# Patient Record
Sex: Female | Born: 1955 | Race: White | Hispanic: No | Marital: Married | Smoking: Never smoker
Health system: Southern US, Community
[De-identification: ages and names within clinical notes are randomized; demographics above are authoritative.]

## PROBLEM LIST (undated history)

## (undated) DIAGNOSIS — K219 Gastro-esophageal reflux disease without esophagitis: Secondary | ICD-10-CM

## (undated) DIAGNOSIS — M5126 Other intervertebral disc displacement, lumbar region: Secondary | ICD-10-CM

## (undated) DIAGNOSIS — T7840XA Allergy, unspecified, initial encounter: Secondary | ICD-10-CM

## (undated) DIAGNOSIS — Z8719 Personal history of other diseases of the digestive system: Secondary | ICD-10-CM

## (undated) DIAGNOSIS — Z780 Asymptomatic menopausal state: Secondary | ICD-10-CM

## (undated) DIAGNOSIS — K222 Esophageal obstruction: Secondary | ICD-10-CM

## (undated) HISTORY — DX: Other intervertebral disc displacement, lumbar region: M51.26

## (undated) HISTORY — PX: TOTAL ABDOMINAL HYSTERECTOMY: SHX209

## (undated) HISTORY — PX: COLONOSCOPY: SHX174

## (undated) HISTORY — DX: Esophageal obstruction: K22.2

## (undated) HISTORY — DX: Allergy, unspecified, initial encounter: T78.40XA

## (undated) HISTORY — DX: Gastro-esophageal reflux disease without esophagitis: K21.9

## (undated) HISTORY — PX: CHOLECYSTECTOMY: SHX55

## (undated) HISTORY — DX: Personal history of other diseases of the digestive system: Z87.19

## (undated) HISTORY — DX: Asymptomatic menopausal state: Z78.0

---

## 1999-02-05 ENCOUNTER — Other Ambulatory Visit: Admission: RE | Admit: 1999-02-05 | Discharge: 1999-02-05 | Payer: Self-pay | Admitting: *Deleted

## 2000-07-07 ENCOUNTER — Other Ambulatory Visit: Admission: RE | Admit: 2000-07-07 | Discharge: 2000-07-07 | Payer: Self-pay | Admitting: Family Medicine

## 2002-04-26 ENCOUNTER — Other Ambulatory Visit: Admission: RE | Admit: 2002-04-26 | Discharge: 2002-04-26 | Payer: Self-pay | Admitting: Family Medicine

## 2004-01-02 ENCOUNTER — Other Ambulatory Visit: Admission: RE | Admit: 2004-01-02 | Discharge: 2004-01-02 | Payer: Self-pay | Admitting: Family Medicine

## 2008-07-23 ENCOUNTER — Encounter (INDEPENDENT_AMBULATORY_CARE_PROVIDER_SITE_OTHER): Payer: Self-pay | Admitting: *Deleted

## 2008-08-07 ENCOUNTER — Ambulatory Visit: Payer: Self-pay | Admitting: Family Medicine

## 2008-08-07 DIAGNOSIS — J3489 Other specified disorders of nose and nasal sinuses: Secondary | ICD-10-CM | POA: Insufficient documentation

## 2009-01-27 ENCOUNTER — Ambulatory Visit: Payer: Self-pay | Admitting: Internal Medicine

## 2009-01-27 DIAGNOSIS — J069 Acute upper respiratory infection, unspecified: Secondary | ICD-10-CM | POA: Insufficient documentation

## 2009-08-22 ENCOUNTER — Ambulatory Visit: Payer: Self-pay | Admitting: Family Medicine

## 2009-08-22 DIAGNOSIS — M79609 Pain in unspecified limb: Secondary | ICD-10-CM | POA: Insufficient documentation

## 2009-08-25 ENCOUNTER — Telehealth: Payer: Self-pay | Admitting: Family Medicine

## 2009-08-26 ENCOUNTER — Telehealth: Payer: Self-pay | Admitting: Family Medicine

## 2009-08-26 ENCOUNTER — Encounter: Payer: Self-pay | Admitting: Family Medicine

## 2009-11-21 ENCOUNTER — Telehealth: Payer: Self-pay | Admitting: Family Medicine

## 2010-05-08 ENCOUNTER — Telehealth: Payer: Self-pay | Admitting: Family Medicine

## 2010-06-09 ENCOUNTER — Ambulatory Visit (HOSPITAL_COMMUNITY): Admission: RE | Admit: 2010-06-09 | Discharge: 2010-06-10 | Payer: Self-pay | Admitting: Obstetrics and Gynecology

## 2010-06-09 ENCOUNTER — Encounter (INDEPENDENT_AMBULATORY_CARE_PROVIDER_SITE_OTHER): Payer: Self-pay | Admitting: Obstetrics and Gynecology

## 2010-07-12 HISTORY — PX: ABDOMINAL HYSTERECTOMY: SHX81

## 2010-08-11 NOTE — Progress Notes (Signed)
Summary: FAILED REFERRAL  Phone Note Other Incoming   Summary of Call: REF ORTHOPAEDIC HAND REFERRAL.....Marland KitchenPER FAX FROM [facility], THEY CONTACTED PT TO [facility] & PATIENT INFORMED THEM SHE DOES NOT WANT AN APPT. Initial call taken by: Magdalen Spatz [facility],  August 26, 2009 3:53 PM  Follow-up for Phone Call        pt should need no more pain meds then---  Follow-up by: Loreen Freud DO,  August 26, 2009 4:04 PM

## 2010-08-11 NOTE — Progress Notes (Signed)
Summary: Pain meds her sick(lmom 2/15)  Phone Note Call from Patient Call back at Home Phone [phone]   Reason for Call: Talk to Doctor Details for Reason: Pain meds making her sick. Summary of Call: Patient is taking the Tramadol and it is making her sick on her stomach and dizzy and nauseous. She has taken ibuprofen 800mg  before and it seemed to help. Can you call her something else in. She said when she takes OTC meds she feels like she takes to many of them.  [facility] [address] [facility] Initial call taken by: Harold Barban,  August 26, 2009 11:01 AM  Follow-up for Phone Call        mobic 15mg   1/2 - 1 by mouth once daily as needed #30   Follow-up by: Loreen Freud DO,  August 26, 2009 11:55 AM  Additional Follow-up for Phone Call Additional follow up Details #1::        LMTCB. Army Fossa CMA  August 26, 2009 12:05 PM     Additional Follow-up for Phone Call Additional follow up Details #2::    Pt is aware. Army Fossa CMA  August 26, 2009 1:30 PM   New/Updated Medications: MOBIC 15 MG TABS (MELOXICAM) 1/2- 1 by mouth once daily as needed. Prescriptions: MOBIC 15 MG TABS (MELOXICAM) 1/2- 1 by mouth once daily as needed.  #30 x 0   Entered by:   Army Fossa CMA   Authorized by:   Loreen Freud DO   Signed by:   Army Fossa CMA on 08/26/2009   Method used:   [facility]  [address] [id number]       [address]       [place], [place]  [address]       Ph: [phone] or [phone]       Fax: [phone]   RxID:   [id number]

## 2010-08-11 NOTE — Assessment & Plan Note (Signed)
Summary: HURT RIGHT HAND LAST WEEK ///SPH   Vital Signs:  Patient profile:   55 year old female Weight:      167 pounds Temp:     98.7 degrees F oral Pulse rate:   65 / minute Pulse rhythm:   regular BP sitting:   102 / 80  (left arm) Cuff size:   large  Vitals Entered By: Army Fossa CMA (August 22, 2009 3:37 PM) CC: Pt c/o right hand pain in between knuckles for over a week and a half.    History of Present Illness:  Injury      This is a 55 year old woman who presents with An injury.  The symptoms began 2 weeks ago.  Pt here c/o hit R hand on counter top about 2 weeks ago.  It swelled and then went down but was left with a knot that is painful.  The patient also reports tenderness.  The patient denies swelling, redness, increased warmth deformity, blood loss, numbness, weakness, loss of sensation, coolness of extremity, and loss of consciousness.  The patient denies the following risk factors for significant bleeding: aspirin use, anticoagulant use, and history of bleeding disorder.  Screening for risk of abuse was negative.    Current Medications (verified): 1)  None  Allergies (verified): No Known Drug Allergies  Past History:  Past medical, surgical, family and social histories (including risk factors) reviewed for relevance to current acute and chronic problems.  Past Medical History: Reviewed history from 08/07/2008 and no changes required. menopause at age 55  Past Surgical History: Reviewed history from 08/07/2008 and no changes required. Cholecystectomy  Family History: Reviewed history from 08/07/2008 and no changes required. CAD-no HTN-no DM-mother,father STROKE-grandparent COLON CA-no BREAST CA-grandmother  Social History: Reviewed history from 08/07/2008 and no changes required. lives w/ husband and daughter Baxter Hire no tobacco works as Midwife at [employer]  Review of Systems      See HPI  Physical Exam  General:   Well-developed,well-nourished,in no acute distress; alert,appropriate and cooperative throughout examination Extremities:  R hand-- + nodule between 2nd and 3rd PIP no swelling or errythema tender to touch Psych:  Oriented X3 and normally interactive.     Impression & Recommendations:  Problem # 1:  HAND PAIN, RIGHT (ICD-729.5) consider ortho referral if pt gets no relief Orders: T-Hand Right 3 views (73130TC) Ankle / Wrist Splint (T5176)

## 2010-08-11 NOTE — Progress Notes (Signed)
Summary: REQUEST FOR PAIN MED  Phone Note Call from Patient Call back at Home Phone [phone]   Caller: Patient Call For: [name],Alison Anderson Reason for Call: Talk to Nurse Summary of Call: PATIENT CALLING, SHE WAS SEEN FOR HER RIGHT HAND INJURY ON 08-22-2009 BY DR. Laury Axon, HAND STILL SWOLLEN, VERY PAINFUL.  REQUESTING RX FOR PAIN MED [facility] [address] Initial call taken by: Magdalen Spatz [facility],  August 25, 2009 9:34 AM  Follow-up for Phone Call        ultram 50 mg  #60  1 by mouth every 6 hours as needed and refer to hand surgeon Follow-up by: Loreen Freud DO,  August 25, 2009 9:53 AM  Additional Follow-up for Phone Call Additional follow up Details #1::        Pt is aware. Army Fossa CMA  August 25, 2009 10:27 AM     New/Updated Medications: ULTRAM 50 MG TABS (TRAMADOL HCL) 1 by mouth every 6 hours as needed for pain. Prescriptions: ULTRAM 50 MG TABS (TRAMADOL HCL) 1 by mouth every 6 hours as needed for pain.  #60 x 0   Entered by:   Army Fossa CMA   Authorized by:   Loreen Freud DO   Signed by:   Army Fossa CMA on 08/25/2009   Method used:   [facility]  [address] [id number]       [address]       [place], [place]  [address]       Ph: [phone] or [phone]       Fax: [phone]   RxID:   [id number]

## 2010-08-11 NOTE — Progress Notes (Signed)
Summary: review x-ray,pain med,referralfyi pt wil hold off on referral  Phone Note Call from Patient Call back at [facility] Phone [phone]   Caller: Patient Summary of Call:  PATIENT left VM that SHE WAS SEEN FOR HER RIGHT HAND INJURY ON 08-22-2009 BY DR. Laury Axon DUE TO Annalyse Langlais ON LEAVE. PT STILL C/O PAIN IN HAND. PT WOULD LIKE FOR X-RAY TO BE REVIEW AGAIN BECAUSE SHE FEELS THAT IT SHOULD HAVE REVEALS SOMETHING SINCE SHE IS STILL EXPERIENCE THE PAIN. PT WOULD ALSO LIKE ANOTHER PAIN MED RX, and REFERRAL TO SEE ORTHO SURGEON..see phone note 08-26-09...........Marland KitchenFelecia Deloach CMA  Nov 21, 2009 10:13 AM   Follow-up for Phone Call        LEFT MSG FOR PT TO CALL RE; DR Beverely Low RECOMMENDATIONS. PER DR Timmia Cogburn PT CAN TAKE IBUPROFEN 4 EVERY 8 HOURS AND CAN ALTERNATE WITH TYLENOL EVERY 4 HOURS. NEEDS TO GO TO ORTHO, ALSO IF SX  SEVERE CAN GO TO [facility] AFTER HOURS WALK IN CLINIC ON [address] .Kandice Hams  Nov 21, 2009 10:50 AM Spoke with pt given Dr Beverely Low recommendations, pt say she will hold off on Ortho Referral for now, will try Ibuprofen and alt with tylenol, in ref to walk in cliinic pt says she does not want to pay a $70 copay, .Kandice Hams  Nov 21, 2009 12:47 PM    Additional Follow-up for Phone Call Additional follow up Details #1::        noted Additional Follow-up by: Neena Rhymes MD,  Nov 21, 2009 12:48 PM

## 2010-08-11 NOTE — Progress Notes (Signed)
Summary: FYI surgery APPT  Phone Note Call from Patient   Summary of Call: patient called to let Dr Beverely Low know that she is going to have a partial hysterectomy - but she doesnt want to use the gyn at [facility] because they are asking for a large deposit - she wants a referral to [facility] - patient doesnt want to make an appt  Initial call taken by: Okey Regal [place],  May 08, 2010 4:16 PM  Follow-up for Phone Call        left message on machine .........Marland KitchenDoristine Devoid CMA  May 11, 2010 2:08 PM   Additional Follow-up for Phone Call Additional follow up Details #1::        Spoke with pt can disregard info given earlier. Pt has work everything out and is to see Dr Edward Jolly at [facility]. Pt would just like dr Beverely Low to be aware that she has a pending appt for surgery on 06-09-10............Marland KitchenFelecia Deloach CMA  May 11, 2010 3:55 PM     Additional Follow-up for Phone Call Additional follow up Details #2::    noted Follow-up by: Neena Rhymes MD,  May 11, 2010 3:58 PM

## 2010-08-11 NOTE — Letter (Signed)
Summary: Patient Does Not Want Appt/[facility]  Patient Does Not Want Appt/[facility]   Imported By: Lanelle Bal 08/30/2009 08:02:42  _____________________________________________________________________  External Attachment:    Type:   Image     Comment:   External Document

## 2010-09-22 LAB — COMPREHENSIVE METABOLIC PANEL
ALT: 20 U/L (ref 0–35)
Albumin: 4 g/dL (ref 3.5–5.2)
Alkaline Phosphatase: 46 U/L (ref 39–117)
BUN: 17 mg/dL (ref 6–23)
CO2: 27 mEq/L (ref 19–32)
Calcium: 9.5 mg/dL (ref 8.4–10.5)
Creatinine, Ser: 0.83 mg/dL (ref 0.4–1.2)
Glucose, Bld: 76 mg/dL (ref 70–99)
Potassium: 4.1 mEq/L (ref 3.5–5.1)
Sodium: 140 mEq/L (ref 135–145)

## 2010-09-22 LAB — URINALYSIS, ROUTINE W REFLEX MICROSCOPIC
Glucose, UA: NEGATIVE mg/dL
Hgb urine dipstick: NEGATIVE
Ketones, ur: NEGATIVE mg/dL
Protein, ur: NEGATIVE mg/dL
Specific Gravity, Urine: 1.01 (ref 1.005–1.030)
pH: 7 (ref 5.0–8.0)

## 2010-09-22 LAB — CBC
HCT: 33.2 % — ABNORMAL LOW (ref 36.0–46.0)
HCT: 39.5 % (ref 36.0–46.0)
Hemoglobin: 11.1 g/dL — ABNORMAL LOW (ref 12.0–15.0)
Hemoglobin: 13.1 g/dL (ref 12.0–15.0)
MCH: 31.6 pg (ref 26.0–34.0)
MCHC: 33.5 g/dL (ref 30.0–36.0)
MCV: 94.2 fL (ref 78.0–100.0)
MCV: 94.5 fL (ref 78.0–100.0)
RDW: 13.3 % (ref 11.5–15.5)
RDW: 13.9 % (ref 11.5–15.5)
WBC: 5.5 10*3/uL (ref 4.0–10.5)

## 2010-09-22 LAB — BASIC METABOLIC PANEL
BUN: 10 mg/dL (ref 6–23)
CO2: 30 mEq/L (ref 19–32)
Chloride: 100 mEq/L (ref 96–112)
Creatinine, Ser: 0.7 mg/dL (ref 0.4–1.2)
GFR calc non Af Amer: >60 mL/min (ref >60)
Glucose, Bld: 126 mg/dL — ABNORMAL HIGH (ref 70–99)

## 2011-02-17 ENCOUNTER — Encounter: Payer: Self-pay | Admitting: Family Medicine

## 2011-02-19 ENCOUNTER — Encounter: Payer: Self-pay | Admitting: Family Medicine

## 2011-02-19 ENCOUNTER — Ambulatory Visit (INDEPENDENT_AMBULATORY_CARE_PROVIDER_SITE_OTHER): Payer: BC Managed Care – PPO | Admitting: Family Medicine

## 2011-02-19 DIAGNOSIS — M79609 Pain in unspecified limb: Secondary | ICD-10-CM

## 2011-02-19 DIAGNOSIS — M79606 Pain in leg, unspecified: Secondary | ICD-10-CM

## 2011-02-19 MED ORDER — CYCLOBENZAPRINE HCL 10 MG PO TABS: 10.0000 mg | ORAL_TABLET | Freq: Three times a day (TID) | ORAL | Status: DC | PRN

## 2011-02-19 MED ORDER — NAPROXEN 500 MG PO TABS: 500.0000 mg | ORAL_TABLET | Freq: Two times a day (BID) | ORAL | Status: DC

## 2011-02-19 MED ORDER — HYDROCODONE-ACETAMINOPHEN 5-500 MG PO TABS: 1.0000 | ORAL_TABLET | ORAL | Status: AC | PRN

## 2011-02-19 NOTE — Progress Notes (Signed)
  Subjective:    Patient ID: Alison Anderson, female    DOB: 02-01-1956, 55 y.o.   MRN: [id number]  HPI Larey Seat at work- fall occurred 11 days ago, slipped on wet floor.  Fell on R side.  Within last 2 days developed increased pain on R lateral thigh and groin.  Has been taking ibuprofen w/out relief.  Will hurt w/ rolling over or changing position.  No pain at rest.   Review of Systems For ROS see HPI     Objective:   Physical Exam  Vitals reviewed. Constitutional: She appears well-developed and well-nourished. No distress.  Musculoskeletal: Normal range of motion. She exhibits tenderness (over R lateral quad). She exhibits no edema.       Full flexion/extension, internal/external rotation of R hip. No bony tenderness over R hip or pelvis  Neurological: She has normal reflexes. She exhibits normal muscle tone. Coordination normal.  Skin: Skin is warm and dry. No erythema.          Assessment & Plan:

## 2011-02-19 NOTE — Patient Instructions (Addendum)
This appears to be a muscular and not bony injury Start the Naproxen daily for pain relief- take for 7-10 days regularly and then as needed.  Take w/ food to avoid upset stomach Use the Flexeril as needed for muscle spasm- take at night to help w/ sleep Take the Vicodin as needed for severe pain Alternate ice and heat for pain relief If no better by mid week- call and we'll get xrays Hang in there!

## 2011-02-22 ENCOUNTER — Other Ambulatory Visit: Payer: Self-pay | Admitting: Family Medicine

## 2011-02-22 NOTE — Telephone Encounter (Signed)
Should have been faxed on Friday but phoned in just in case pharmacy did not receive fax

## 2011-02-23 DIAGNOSIS — M79606 Pain in leg, unspecified: Secondary | ICD-10-CM | POA: Insufficient documentation

## 2011-02-23 NOTE — Assessment & Plan Note (Signed)
Pt's leg pain consistent w/ muscular pain rather than joint or bone pain.  Likely a deep tissue bruise/strain.  Start NSAIDs, muscle relaxers, and pain meds prn.  If no improvement will get xrays and refer to ortho.  Reviewed supportive care and red flags that should prompt return.  Pt expressed understanding and is in agreement w/ plan.

## 2013-06-15 ENCOUNTER — Ambulatory Visit (INDEPENDENT_AMBULATORY_CARE_PROVIDER_SITE_OTHER): Payer: BC Managed Care – PPO | Admitting: Family Medicine

## 2013-06-15 ENCOUNTER — Encounter: Payer: Self-pay | Admitting: Family Medicine

## 2013-06-15 VITALS — BP 120/80 | HR 89 | Temp 98.1°F | Resp 16 | Wt 167.0 lb

## 2013-06-15 DIAGNOSIS — M62830 Muscle spasm of back: Secondary | ICD-10-CM | POA: Insufficient documentation

## 2013-06-15 DIAGNOSIS — M5380 Other specified dorsopathies, site unspecified: Secondary | ICD-10-CM

## 2013-06-15 MED ORDER — NAPROXEN 500 MG PO TABS: 500.0000 mg | ORAL_TABLET | Freq: Two times a day (BID) | ORAL | Status: DC

## 2013-06-15 MED ORDER — CYCLOBENZAPRINE HCL 10 MG PO TABS: 10.0000 mg | ORAL_TABLET | Freq: Three times a day (TID) | ORAL | Status: DC | PRN

## 2013-06-15 NOTE — Patient Instructions (Signed)
Schedule your complete physical at your convenience Start the Naproxen twice daily- take w/ food Use the Flexeril for muscle spasm- will cause drowsiness so best on nights/weekend HEAT! Call with any questions or concerns Hang in there! Happy Holidays!

## 2013-06-15 NOTE — Assessment & Plan Note (Signed)
New.  Start scheduled NSAIDs, muscle relaxer prn.  Heat.  Reviewed supportive care and red flags that should prompt return.  Pt expressed understanding and is in agreement w/ plan.

## 2013-06-15 NOTE — Progress Notes (Signed)
   Subjective:    Patient ID: Alison Anderson, female    DOB: 1955/11/15, 57 y.o.   MRN: [id number]  HPI Pre visit review using our clinic review tool, if applicable. No additional management support is needed unless otherwise documented below in the visit note.  Back pain- sxs started 'a couple of week at least'.  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  Took a few of husband's ibuprofen 800mg  w/ some relief.  This AM was unable to stand upright when getting out of bed.  Has been 'bent over' all day.  No radiation of pain, localized to lumbar region.  Pain improves w/ bending forward, worse w/ extension.  No bowel or bladder incontinence.  No weakness or numbness of legs.   Review of Systems For ROS see HPI     Objective:   Physical Exam  Vitals reviewed. Constitutional: She is oriented to person, place, and time. She appears well-developed and well-nourished. No distress.  Cardiovascular: Intact distal pulses.   Musculoskeletal: She exhibits tenderness (over lumbar paraspinals bilaterally). She exhibits no edema.  + lumbar paraspinal spasm Pain w/ extension, no pain w/ flexion (-) SLR bilaterally  Neurological: She is alert and oriented to person, place, and time. She has normal reflexes. No cranial nerve deficit. Coordination normal.  Skin: Skin is warm and dry.          Assessment & Plan:

## 2013-06-18 ENCOUNTER — Telehealth: Payer: Self-pay | Admitting: *Deleted

## 2013-06-18 NOTE — Telephone Encounter (Signed)
Patient called and left message on triage line. She would like to know if she can be prescribed a stronger pain medication. She stated she had to miss work today because of the pain. Please advise.

## 2013-06-18 NOTE — Telephone Encounter (Signed)
Ok for Tramadol 50mg  TID prn pain.  #30, no refills

## 2013-06-20 ENCOUNTER — Telehealth: Payer: Self-pay | Admitting: *Deleted

## 2013-06-20 ENCOUNTER — Other Ambulatory Visit: Payer: Self-pay | Admitting: *Deleted

## 2013-06-20 MED ORDER — TRAMADOL HCL 50 MG PO TABS: 50.0000 mg | ORAL_TABLET | Freq: Three times a day (TID) | ORAL | Status: DC | PRN

## 2013-06-20 NOTE — Telephone Encounter (Signed)
Rx sent by Ricarda Frame

## 2013-06-20 NOTE — Telephone Encounter (Signed)
ok'd Tramadol #30, TID PRN in phone note from 12/8

## 2013-06-20 NOTE — Telephone Encounter (Signed)
Patient called and stated that she was given Naproxen and feels like its not working. Patient states that she has to work through the pain while at work. Patient states she will like to know if there is something else that she can take that will last longer. SW

## 2013-06-20 NOTE — Telephone Encounter (Signed)
Rx fro [facility]ad[facility] [address]

## 2013-06-21 MED ORDER — HYDROCODONE-ACETAMINOPHEN 5-325 MG PO TABS: 1.0000 | ORAL_TABLET | Freq: Four times a day (QID) | ORAL | Status: DC | PRN

## 2013-06-21 NOTE — Telephone Encounter (Signed)
Patient notified and state that she will by to pick up

## 2013-06-21 NOTE — Telephone Encounter (Signed)
Patient has tramadol on her allergies list. Is there anything else that she can take? Please advise. SW

## 2013-06-21 NOTE — Telephone Encounter (Signed)
Ok for Hydrocodone 5/235mg  1 tab every 6 hrs PRN.  #20, no refills.  Needs to pick up

## 2013-06-25 ENCOUNTER — Other Ambulatory Visit: Payer: Self-pay | Admitting: Family Medicine

## 2013-06-25 ENCOUNTER — Telehealth: Payer: Self-pay | Admitting: *Deleted

## 2013-06-25 NOTE — Telephone Encounter (Signed)
Unfortunately I authorized the tramadol prescription in error in attempts to help the pt's pain.  There appears to have been mistakes at multiple levels- here in our office, the pharmacy in filling prescription that is listed as allergy, and husband in paying for med.  Please call pharmacy to determine how this could have made it through.  At this time, since pt has no insurance it may be best to reimburse since there was a mistake.  Please convey my apologies and tell Alison Anderson how thankful we are she did not take the med.

## 2013-06-25 NOTE — Telephone Encounter (Signed)
Patient called and stated that she had called last week to get a stronger pain medication (see last phone note). Patient's husband came and picked up the printed prescription for the hydrocodone and took it to the pharmacy to be filled. Patient states that when he picked up the prescription and was given the prescription for the for Tramadol. Patient called very upset that the medication is on her allergy list. Patient states that she would like to know why this was sent in and by who. Patient also stressed th fact that she is on a fix income and would like for someone to reimbursed for the cost of that medication. Patient wanted to speak with an manger. Patient can be called back at [phone] Please advise.

## 2013-06-25 NOTE — Telephone Encounter (Signed)
Spoke with patient and expressed that we are sorry that this has happened. Patient states that she was very upset at the time but now  understand that mistakes happen. Pharmacy was called and made aware of allergies. Patient states that the prescription was $12.00. Made patient aware that this is being handled by management. Patient fully understood. SW

## 2013-06-27 NOTE — Telephone Encounter (Signed)
Arlys John,   With Harriett Sine out until tomorrow, can you help with this?   Dois Davenport

## 2014-03-14 ENCOUNTER — Other Ambulatory Visit: Payer: Self-pay | Admitting: Family Medicine

## 2014-03-14 DIAGNOSIS — Z1231 Encounter for screening mammogram for malignant neoplasm of breast: Secondary | ICD-10-CM

## 2014-03-19 ENCOUNTER — Telehealth: Payer: Self-pay | Admitting: Family Medicine

## 2014-03-19 NOTE — Telephone Encounter (Signed)
Error

## 2014-03-27 ENCOUNTER — Ambulatory Visit (HOSPITAL_BASED_OUTPATIENT_CLINIC_OR_DEPARTMENT_OTHER)
Admission: RE | Admit: 2014-03-27 | Discharge: 2014-03-27 | Disposition: A | Discharge status: Home / Self Care | Payer: BC Managed Care – PPO | Source: Ambulatory Visit | Attending: Family Medicine | Admitting: Family Medicine

## 2014-03-27 DIAGNOSIS — Z1231 Encounter for screening mammogram for malignant neoplasm of breast: Secondary | ICD-10-CM | POA: Diagnosis present

## 2014-04-19 ENCOUNTER — Ambulatory Visit: Payer: BC Managed Care – PPO | Admitting: Family Medicine

## 2014-06-05 ENCOUNTER — Ambulatory Visit (INDEPENDENT_AMBULATORY_CARE_PROVIDER_SITE_OTHER): Payer: BC Managed Care – PPO | Admitting: Family Medicine

## 2014-06-05 ENCOUNTER — Other Ambulatory Visit: Payer: Self-pay | Admitting: Family Medicine

## 2014-06-05 ENCOUNTER — Encounter: Payer: Self-pay | Admitting: Family Medicine

## 2014-06-05 ENCOUNTER — Telehealth: Payer: Self-pay | Admitting: *Deleted

## 2014-06-05 VITALS — BP 122/78 | HR 79 | Temp 97.9°F | Resp 16 | Ht 67.5 in | Wt 166.5 lb

## 2014-06-05 DIAGNOSIS — Z23 Encounter for immunization: Secondary | ICD-10-CM

## 2014-06-05 DIAGNOSIS — Z0000 Encounter for general adult medical examination without abnormal findings: Secondary | ICD-10-CM

## 2014-06-05 LAB — CBC WITH DIFFERENTIAL/PLATELET
BASOS ABS: 0.1 10*3/uL (ref 0.0–0.1)
Basophils Relative: 1 % (ref 0–1)
Eosinophils Absolute: 0.2 10*3/uL (ref 0.0–0.7)
Eosinophils Relative: 4 % (ref 0–5)
HEMATOCRIT: 40 % (ref 36.0–46.0)
HEMOGLOBIN: 12.9 g/dL (ref 12.0–15.0)
LYMPHS PCT: 33 % (ref 12–46)
Lymphs Abs: 2 10*3/uL (ref 0.7–4.0)
MCH: 29.3 pg (ref 26.0–34.0)
MCHC: 32.3 g/dL (ref 30.0–36.0)
MCV: 90.7 fL (ref 78.0–100.0)
MONO ABS: 0.3 10*3/uL (ref 0.1–1.0)
MONOS PCT: 5 % (ref 3–12)
MPV: 11.1 fL (ref 9.4–12.4)
NEUTROS ABS: 3.5 10*3/uL (ref 1.7–7.7)
Neutrophils Relative %: 57 % (ref 43–77)
Platelets: 259 10*3/uL (ref 150–400)
RBC: 4.41 MIL/uL (ref 3.87–5.11)
RDW: 13.9 % (ref 11.5–15.5)
WBC: 6.1 10*3/uL (ref 4.0–10.5)

## 2014-06-05 LAB — BASIC METABOLIC PANEL
BUN: 19 mg/dL (ref 6–23)
CO2: 28 mEq/L (ref 19–32)
Calcium: 9.4 mg/dL (ref 8.4–10.5)
Chloride: 102 mEq/L (ref 96–112)
Creat: 0.81 mg/dL (ref 0.50–1.10)
GLUCOSE: 87 mg/dL (ref 70–99)
POTASSIUM: 3.9 meq/L (ref 3.5–5.3)
SODIUM: 139 meq/L (ref 135–145)

## 2014-06-05 LAB — HEPATIC FUNCTION PANEL
ALBUMIN: 4 g/dL (ref 3.5–5.2)
ALT: 14 U/L (ref 0–35)
AST: 23 U/L (ref 0–37)
Alkaline Phosphatase: 46 U/L (ref 39–117)
BILIRUBIN DIRECT: 0.1 mg/dL (ref 0.0–0.3)
Indirect Bilirubin: 0.2 mg/dL (ref 0.2–1.2)
TOTAL PROTEIN: 6.5 g/dL (ref 6.0–8.3)
Total Bilirubin: 0.3 mg/dL (ref 0.2–1.2)

## 2014-06-05 LAB — LIPID PANEL
CHOL/HDL RATIO: 2.1 ratio
CHOLESTEROL: 165 mg/dL (ref 0–200)
HDL: 78 mg/dL (ref >39)
LDL Cholesterol: 73 mg/dL (ref 0–99)
Triglycerides: 71 mg/dL (ref <150)
VLDL: 14 mg/dL (ref 0–40)

## 2014-06-05 NOTE — Telephone Encounter (Signed)
Pt here for cpe and asked at end of visit if she should start medication for urinary frequency that she has at night (3-4 times a night) for "a long time" and also wanted to talk with Provider re: muscle cramps. Per verbal from Provider, pt should schedule office visit at her earliest convenience to discuss both concerns. Attempted to reach pt. Call was answered and disconnected.

## 2014-06-05 NOTE — Progress Notes (Signed)
Pre visit review using our clinic review tool, if applicable. No additional management support is needed unless otherwise documented below in the visit note. 

## 2014-06-05 NOTE — Patient Instructions (Signed)
Follow up in 1 year or as needed We'll notify you of your lab results and make any changes if needed Keep up the good work on healthy diet and regular exercise Call with any questions or concerns Happy Holidays!!! 

## 2014-06-05 NOTE — Progress Notes (Signed)
   Subjective:    Patient ID: Alison MontgomeryKaren F Sapia, female    DOB: [birth year], 58 y.o.   MRN: [id number]  HPI CPE- no concerns.  UTD on mammo,  No need for pap smears.  Due for colonoscopy but pt is dealing w/ husband's health issues and is going to defer at this time.  UTD on flu shot.  Plan to update Tdap today.   Review of Systems Patient reports no vision/ hearing changes, adenopathy,fever, weight change,  persistant/recurrent hoarseness , swallowing issues, chest pain, palpitations, edema, persistant/recurrent cough, hemoptysis, dyspnea (rest/exertional/paroxysmal nocturnal), gastrointestinal bleeding (melena, rectal bleeding), abdominal pain, significant heartburn, bowel changes, GU symptoms (dysuria, hematuria, incontinence), Gyn symptoms (abnormal  bleeding, pain),  syncope, focal weakness, memory loss, numbness & tingling, skin/hair/nail changes, abnormal bruising or bleeding, anxiety, or depression.     Objective:   Physical Exam General Appearance:    Alert, cooperative, no distress, appears stated age  Head:    Normocephalic, without obvious abnormality, atraumatic  Eyes:    PERRL, conjunctiva/corneas clear, EOM's intact, fundi    benign, both eyes  Ears:    Normal TM's and external ear canals, both ears  Nose:   Nares normal, septum midline, mucosa normal, no drainage    or sinus tenderness  Throat:   Lips, mucosa, and tongue normal; teeth and gums normal  Neck:   Supple, symmetrical, trachea midline, no adenopathy;    Thyroid: no enlargement/tenderness/nodules  Back:     Symmetric, no curvature, ROM normal, no CVA tenderness  Lungs:     Clear to auscultation bilaterally, respirations unlabored  Chest Wall:    No tenderness or deformity   Heart:    Regular rate and rhythm, S1 and S2 normal, no murmur, rub   or gallop  Breast Exam:    Deferred to mammo  Abdomen:     Soft, non-tender, bowel sounds active all four quadrants,    no masses, no organomegaly  Genitalia:    Deferred    Rectal:    Extremities:   Extremities normal, atraumatic, no cyanosis or edema  Pulses:   2+ and symmetric all extremities  Skin:   Skin color, texture, turgor normal, no rashes or lesions  Lymph nodes:   Cervical, supraclavicular, and axillary nodes normal  Neurologic:   CNII-XII intact, normal strength, sensation and reflexes    throughout          Assessment & Plan:

## 2014-06-06 LAB — VITAMIN D 25 HYDROXY (VIT D DEFICIENCY, FRACTURES): VIT D 25 HYDROXY: 43 ng/mL (ref 30–100)

## 2014-06-06 LAB — TSH: TSH: 1.525 u[IU]/mL (ref 0.350–4.500)

## 2014-06-07 ENCOUNTER — Encounter: Payer: Self-pay | Admitting: General Practice

## 2014-06-07 NOTE — Telephone Encounter (Signed)
Please call pt and schedule an appt with tabori.

## 2014-06-09 NOTE — Assessment & Plan Note (Signed)
Pt's PE WNL.  UTD on mammo, no need for pap, due for colonoscopy but pt prefers to pay off someone of husband's medical bills prior to having this done.  Check labs.  Anticipatory guidance provided.

## 2014-06-11 NOTE — Telephone Encounter (Signed)
Left message for patient to return my call.

## 2014-07-09 ENCOUNTER — Ambulatory Visit: Payer: BC Managed Care – PPO | Admitting: Family Medicine

## 2014-08-01 ENCOUNTER — Ambulatory Visit: Payer: BC Managed Care – PPO | Admitting: Family Medicine

## 2014-08-07 ENCOUNTER — Ambulatory Visit: Payer: BC Managed Care – PPO | Admitting: Family Medicine

## 2015-03-11 ENCOUNTER — Encounter: Payer: Self-pay | Admitting: Physician Assistant

## 2015-03-11 ENCOUNTER — Ambulatory Visit (INDEPENDENT_AMBULATORY_CARE_PROVIDER_SITE_OTHER): Payer: BC Managed Care – PPO | Admitting: Physician Assistant

## 2015-03-11 VITALS — BP 122/84 | HR 96 | Temp 98.6°F | Resp 16 | Ht 67.5 in | Wt 168.1 lb

## 2015-03-11 DIAGNOSIS — J00 Acute nasopharyngitis [common cold]: Secondary | ICD-10-CM

## 2015-03-11 DIAGNOSIS — B9689 Other specified bacterial agents as the cause of diseases classified elsewhere: Secondary | ICD-10-CM

## 2015-03-11 DIAGNOSIS — J208 Acute bronchitis due to other specified organisms: Principal | ICD-10-CM

## 2015-03-11 MED ORDER — AZITHROMYCIN 250 MG PO TABS: ORAL_TABLET | ORAL | Status: DC

## 2015-03-11 NOTE — Progress Notes (Signed)
   Patient presents to clinic today c/o deep cough with chest congestion, headaches, PND, nausea and aches over past 1.5 weeks. Is coughing up green phlegm over past 5 days. Denies fever, chills, SOB, or chest pain. Denies recent travel or sick contact.   Past Medical History  Diagnosis Date  . Menopause     at 10    Current Outpatient Prescriptions on File Prior to Visit  Medication Sig Dispense Refill  . CALCIUM PO Take by mouth.    . Multiple Vitamins-Minerals (MULTIVITAMINS) CHEW Chew 1 each by mouth daily.    . Omega-3 Fatty Acids (FISH OIL PO) Take 1 capsule by mouth daily.     No current facility-administered medications on file prior to visit.    Allergies  Allergen Reactions  . Tramadol     Family History  Problem Relation Age of Onset  . Diabetes Mother   . Heart disease Mother   . Hypertension Mother   . Diabetes Father     borderline  . Stroke      grandparent  . Breast cancer      grandmother  . Cancer Maternal Grandmother     breast    Social History   Social History  . Marital Status: Married    Spouse Name: N/A  . Number of Children: N/A  . Years of Education: N/A   Social History Main Topics  . Smoking status: Never Smoker   . Smokeless tobacco: None  . Alcohol Use: 0.0 oz/week    0 Standard drinks or equivalent per week     Comment: rarely  . Drug Use: None  . Sexual Activity: Not Asked   Other Topics Concern  . 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  All other ROS are negative.  BP 122/84 mmHg  Pulse 96  Temp(Src) 98.6 F (37 C) (Oral)  Resp 16  Ht 5' 7.5" (1.715 m)  Wt 168 lb 2 oz (76.261 kg)  BMI 25.93 kg/m2  SpO2 98%  Physical Exam  Constitutional: She is oriented to person, place, and time and well-developed, well-nourished, and in no distress.  HENT:  Head: Normocephalic and atraumatic.  Mouth/Throat:  Oropharynx is clear and moist.  Eyes: Conjunctivae are normal.  Neck: Neck supple.  Cardiovascular: Normal rate, regular rhythm, normal heart sounds and intact distal pulses.   Pulmonary/Chest: Effort normal and breath sounds normal. No respiratory distress. She has no wheezes. She has no rales. She exhibits no tenderness.  Neurological: She is alert and oriented to person, place, and time.  Skin: Skin is warm and dry. No rash noted.  Psychiatric: Affect normal.  Vitals reviewed.   No results found for this or any previous visit (from the past 2160 hour(s)).  Assessment/Plan: Acute bacterial bronchitis Rx Z-pack. Increase fluids. Mucinex-DM. Humidifier in bedroom. Rest. Follow-up if symptoms are not improving.

## 2015-03-11 NOTE — Patient Instructions (Signed)
Please take the antibiotic as directed. Stay well hydrated and get plenty of rest.  Use Mucinex-DM to break up mucous and to help control cough. Place a humidifier in the bedroom. Call or return to clinic if symptoms are not improving.

## 2015-03-11 NOTE — Progress Notes (Signed)
Pre visit review using our clinic review tool, if applicable. No additional management support is needed unless otherwise documented below in the visit note/SLS  

## 2015-03-12 DIAGNOSIS — J208 Acute bronchitis due to other specified organisms: Principal | ICD-10-CM

## 2015-03-12 DIAGNOSIS — B9689 Other specified bacterial agents as the cause of diseases classified elsewhere: Secondary | ICD-10-CM | POA: Insufficient documentation

## 2015-03-12 NOTE — Assessment & Plan Note (Signed)
Rx Z-pack. Increase fluids. Mucinex-DM. Humidifier in bedroom. Rest. Follow-up if symptoms are not improving.

## 2015-03-26 ENCOUNTER — Encounter: Payer: Self-pay | Admitting: Medical

## 2015-03-26 ENCOUNTER — Ambulatory Visit (INDEPENDENT_AMBULATORY_CARE_PROVIDER_SITE_OTHER): Payer: BC Managed Care – PPO | Admitting: Medical

## 2015-03-26 ENCOUNTER — Ambulatory Visit: Payer: BC Managed Care – PPO | Admitting: Physician Assistant

## 2015-03-26 ENCOUNTER — Telehealth: Payer: Self-pay | Admitting: Family Medicine

## 2015-03-26 VITALS — BP 100/60 | HR 79 | Temp 98.0°F | Resp 16 | Ht 67.5 in | Wt 169.0 lb

## 2015-03-26 DIAGNOSIS — M545 Low back pain, unspecified: Secondary | ICD-10-CM

## 2015-03-26 MED ORDER — HYDROCODONE-ACETAMINOPHEN 5-325 MG PO TABS
ORAL_TABLET | ORAL | Status: DC
Start: 2015-03-26 — End: 2015-03-28

## 2015-03-26 MED ORDER — CYCLOBENZAPRINE HCL 10 MG PO TABS: 10.0000 mg | ORAL_TABLET | Freq: Every day | ORAL | Status: DC

## 2015-03-26 MED ORDER — DICLOFENAC SODIUM 75 MG PO TBEC: 75.0000 mg | DELAYED_RELEASE_TABLET | Freq: Two times a day (BID) | ORAL | Status: DC

## 2015-03-26 NOTE — Patient Instructions (Signed)
Will rx diclofenac.  Rx flexeril to use at night.  Rx norco low dose but only at night if pain severe that effects sleep or she can't find comfortable position.  Rest. Back stretches.  Follow up 7-10 days or as needed.

## 2015-03-26 NOTE — Progress Notes (Signed)
Subjective:    Patient ID: Alison Anderson, female    DOB: June 15, 1956, 59 y.o.   MRN: [id number]  HPI  Pt in with some back pain. Pain came on slight last night. This am more severe and stiff in am. Pt states rare back pain that make her hurt to stand. She works with [employer], [employer]. Bending a lot at work. No fall or injury. Pt tried ibuprofen.   No pain radiating to legs. No numbness to feet. No saddle anesthesia. No leg weakness or foot drop.  Pain worse when bending over.     Review of Systems  Constitutional: Negative for fever, chills and fatigue.  Respiratory: Negative for cough, chest tightness and wheezing.   Cardiovascular: Negative for chest pain and palpitations.  Gastrointestinal: Negative for nausea, vomiting and abdominal pain.  Genitourinary: Negative for dysuria, urgency, hematuria and flank pain.  Musculoskeletal: Positive for back pain.  Skin: Negative for rash.  Neurological: Negative for weakness and numbness.  Hematological: Negative for adenopathy. Does not bruise/bleed easily.    Past Medical History  Diagnosis Date  . Menopause     at 72    Social History   Social History  . Marital Status: Married    Spouse Name: N/A  . Number of Children: N/A  . Years of Education: N/A   Occupational History  . Not on file.   Social History Main Topics  . Smoking status: Never Smoker   . Smokeless tobacco: Not on file  . Alcohol Use: 0.0 oz/week    0 Standard drinks or equivalent per week     Comment: rarely  . Drug Use: Not on file  . Sexual Activity: Not on file   Other Topics Concern  . Not on file   Social History Narrative   Lives with husband and daughter Baxter Hire   Works as a Midwife at [employer]    Past Surgical History  Procedure Laterality Date  . Cholecystectomy    . Abdominal hysterectomy  2012    Family History  Problem Relation Age of Onset  . Diabetes Mother   . Heart disease Mother   . Hypertension Mother   .  Diabetes Father     borderline  . Stroke      grandparent  . Breast cancer      grandmother  . Cancer Maternal Grandmother     breast    Allergies  Allergen Reactions  . Tramadol     Current Outpatient Prescriptions on File Prior to Visit  Medication Sig Dispense Refill  . CALCIUM PO Take by mouth.    . Multiple Vitamins-Minerals (MULTIVITAMINS) CHEW Chew 1 each by mouth daily.     No current facility-administered medications on file prior to visit.    BP 100/60 mmHg  Pulse 79  Temp(Src) 98 F (36.7 C) (Oral)  Resp 16  Ht 5' 7.5" (1.715 m)  Wt 169 lb (76.658 kg)  BMI 26.06 kg/m2  SpO2 98%       Objective:   Physical Exam  General Appearance- Not in acute distress.    Chest and Lung Exam Auscultation: Breath sounds:-Normal. Clear even and unlabored. Adventitious sounds:- No Adventitious sounds.  Cardiovascular Auscultation:Rythm - Regular, rate and rythm. Heart Sounds -Normal heart sounds.  Abdomen Inspection:-Inspection Normal.  Palpation/Perucssion: Palpation and Percussion of the abdomen reveal- Non Tender, No Rebound tenderness, No rigidity(Guarding) and No Palpable abdominal masses.  Liver:-Normal.  Spleen:- Normal.   Back Mid lumbar spine tenderness to palpation.  Pain on straight leg lift.  Pain on lateral movements and flexion/extension of the spine. A lot of pain changing positions.  Lower ext neurologic  L5-S1 sensation intact bilaterally. Normal patellar reflexes bilaterally. No foot drop bilaterally.      Assessment & Plan:  Will rx diclofenac.  Rx flexeril to use at night.  Rx norco low dose but only at night if pain severe that effects sleep or she can't find comfortable position.  Rest. Back stretches.  Follow up 7-10 days or as needed

## 2015-03-26 NOTE — Progress Notes (Signed)
Pre visit review using our clinic review tool, if applicable. No additional management support is needed unless otherwise documented below in the visit note. 

## 2015-03-26 NOTE — Telephone Encounter (Signed)
Error

## 2015-03-27 ENCOUNTER — Telehealth: Payer: Self-pay | Admitting: Family Medicine

## 2015-03-27 NOTE — Telephone Encounter (Signed)
Pt called stating the vicodin made her feel like in a fog, daze, lightheaded, and not lucid. She is not going to take it anymore and wants noted in her chart not to prescribe it anymore. She took it on a full stomach and it still made her feel badly. Pt states the cyclobenzaprine is helping. She took it last night and took it today and it helped her a lot. It didn't make her feel badly. She would like to take it more often and wondering if there is something else to take for pain that is not a narcotic. Pt states that she is allergic to tramadol as well.  Please call pt to advise. Best # [phone].

## 2015-03-27 NOTE — Telephone Encounter (Signed)
Edward please advise about different medication.

## 2015-03-28 MED ORDER — CYCLOBENZAPRINE HCL 5 MG PO TABS: 5.0000 mg | ORAL_TABLET | Freq: Three times a day (TID) | ORAL | Status: DC | PRN

## 2015-03-28 NOTE — Telephone Encounter (Signed)
Spoke with pt and let her know to follow up with PCP for further refills. Pt was not happy and states she will find a way to get this medication.

## 2015-03-28 NOTE — Telephone Encounter (Signed)
Spoke with pt and she voices understanding. Pt will need to follow up with PCP for further refills.

## 2015-03-28 NOTE — Telephone Encounter (Signed)
Let pt know that if she can't take tramadol or hydrocodone then really no good options in terms of purely pain medications. Any other pain med  is likely to cause same or worse side effects. I can send in low dose flexeril that she could take up to 3 times a day. But caution on sedation and don't want her to have a fall. This is why I originally wrote just to take at night. If pain persisting by next week then return/let us know may consider PT. Also would you put hydrocodone on allergy list. Specify fog, dazed and lightheaded.

## 2015-05-12 ENCOUNTER — Ambulatory Visit (INDEPENDENT_AMBULATORY_CARE_PROVIDER_SITE_OTHER): Payer: BC Managed Care – PPO | Admitting: Family Medicine

## 2015-05-12 ENCOUNTER — Encounter: Payer: Self-pay | Admitting: Family Medicine

## 2015-05-12 VITALS — BP 110/72 | HR 99 | Temp 98.3°F | Resp 16 | Ht 68.0 in | Wt 169.5 lb

## 2015-05-12 DIAGNOSIS — Z23 Encounter for immunization: Secondary | ICD-10-CM | POA: Diagnosis not present

## 2015-05-12 DIAGNOSIS — M79672 Pain in left foot: Secondary | ICD-10-CM | POA: Diagnosis not present

## 2015-05-12 MED ORDER — MELOXICAM 15 MG PO TABS: 15.0000 mg | ORAL_TABLET | Freq: Every day | ORAL | Status: DC

## 2015-05-12 NOTE — Progress Notes (Signed)
   Subjective:    Patient ID: Enzo MontgomeryKaren F Larue, female    DOB: 03/10/1956, 59 y.o.   MRN: [id number]  HPI L foot pain- sxs started 1 month ago.  Pain is lateral and she localizes this to 5th metatarsal and posteriorly.  No injury.  Worse as the day goes and w/ prolonged standing.  R leg is starting to hurt due to compensation.  Will take OTC advil prn for pain relief.   Review of Systems For ROS see HPI     Objective:   Physical Exam  Constitutional: She is oriented to person, place, and time. She appears well-developed and well-nourished. No distress.  Cardiovascular: Intact distal pulses.   Musculoskeletal: She exhibits tenderness (mild TTP over L 5th metatarsal). She exhibits no edema.  Neurological: She is alert and oriented to person, place, and time.  Skin: Skin is warm and dry. No erythema.  Psychiatric: She has a normal mood and affect. Her behavior is normal.  Vitals reviewed.         Assessment & Plan:

## 2015-05-12 NOTE — Assessment & Plan Note (Signed)
New.  No evidence of redness or swelling- making gout unlikely.  Pt has very high arches and likely has too much pressure on her lateral foot.  Recommended xray to r/o stress fx- pt declined.  Pt is agreeable to sports med referral.  Will start daily NSAID for relief of pain and inflammation.  Reviewed supportive care and red flags that should prompt return.  Pt expressed understanding and is in agreement w/ plan.

## 2015-05-12 NOTE — Patient Instructions (Signed)
Follow up as needed We'll notify you of your Sports Med appt Take the Mobic once daily- take w/ food- for inflammation (don't add any ibuprofen, aleve, motrin, etc) ICE! Wear good supportive shoes Call with any questions or concerns If you want to join us at the new [place] office, any scheduled appointments will automatically transfer and we will see you at [address] [place], [place], [place] [id number]  Hang in there!!!

## 2015-05-12 NOTE — Progress Notes (Signed)
Pre visit review using our clinic review tool, if applicable. No additional management support is needed unless otherwise documented below in the visit note. 

## 2015-05-13 ENCOUNTER — Telehealth: Payer: Self-pay | Admitting: Family Medicine

## 2015-05-13 ENCOUNTER — Ambulatory Visit: Payer: BC Managed Care – PPO | Admitting: Family Medicine

## 2015-05-13 DIAGNOSIS — M79672 Pain in left foot: Secondary | ICD-10-CM

## 2015-05-13 NOTE — Telephone Encounter (Signed)
Relation to VW:UJWJpt:self Call back number:[phone]   Reason for call:  Patient stated she should have listen to PCP when advised to have x ray of the left foot at last OV,patient requesting orders.

## 2015-05-14 ENCOUNTER — Ambulatory Visit (HOSPITAL_BASED_OUTPATIENT_CLINIC_OR_DEPARTMENT_OTHER)
Admission: RE | Admit: 2015-05-14 | Discharge: 2015-05-14 | Disposition: A | Discharge status: Home / Self Care | Payer: BC Managed Care – PPO | Source: Ambulatory Visit | Attending: Family Medicine | Admitting: Family Medicine

## 2015-05-14 DIAGNOSIS — M79672 Pain in left foot: Secondary | ICD-10-CM | POA: Insufficient documentation

## 2015-05-14 NOTE — Telephone Encounter (Signed)
This xray was ordered and pt was informed.

## 2015-05-19 ENCOUNTER — Ambulatory Visit: Payer: BC Managed Care – PPO | Admitting: Family Medicine

## 2015-05-21 ENCOUNTER — Ambulatory Visit (INDEPENDENT_AMBULATORY_CARE_PROVIDER_SITE_OTHER): Payer: BC Managed Care – PPO | Admitting: Family Medicine

## 2015-05-21 ENCOUNTER — Encounter: Payer: Self-pay | Admitting: Family Medicine

## 2015-05-21 VITALS — BP 172/85 | HR 112 | Ht 68.0 in | Wt 168.0 lb

## 2015-05-21 DIAGNOSIS — M25551 Pain in right hip: Secondary | ICD-10-CM | POA: Diagnosis not present

## 2015-05-21 MED ORDER — HYDROCODONE-ACETAMINOPHEN 5-325 MG PO TABS: 1.0000 | ORAL_TABLET | Freq: Four times a day (QID) | ORAL | Status: DC | PRN

## 2015-05-21 MED ORDER — PREDNISONE 10 MG PO TABS: ORAL_TABLET | ORAL | Status: DC

## 2015-05-21 NOTE — Patient Instructions (Addendum)
You have piriformis syndrome Try to avoid painful activities when possible. Consider tennis ball to massage area when sitting Pick 2-3 stretches where you feel the pull in the area of pain - do 3 of these and hold for 20-30 seconds at least once a day Side hip raises 3 sets of 10 once a day - add ankle weight if this becomes too easy. Prednisone dose pack as directed for 6 days. Consider imaging of your lumbar spine and/or physical therapy if not improving as expected  If your foot pain worsens give me a call.

## 2015-05-22 DIAGNOSIS — M25551 Pain in right hip: Secondary | ICD-10-CM | POA: Insufficient documentation

## 2015-05-22 NOTE — Progress Notes (Signed)
PCP and consultation requested by: Neena RhymesKatherine Tabori, MD  Subjective:   HPI: Patient is a 59 y.o. female here for low back pain into right leg.  Patient reports she's had about 2 weeks of worsening low back pain more in the buttock. Radiating down right leg posteriorly. No numbness or tingling. Comes and goes. Pain level up to 8/10 level. Has tried muscle relaxant, heat/ice. Also having left foot pain but this is much better - minimal pain currently. No skin changes, fever, other complaints.  Past Medical History  Diagnosis Date  . Menopause     at [age]    Current Outpatient Prescriptions on File Prior to Visit  Medication Sig Dispense Refill  . CALCIUM PO Take by mouth.    . Multiple Vitamins-Minerals (MULTIVITAMINS) CHEW Chew 1 each by mouth daily.     No current facility-administered medications on file prior to visit.    Past Surgical History  Procedure Laterality Date  . Cholecystectomy    . Abdominal hysterectomy  2012    Allergies  Allergen Reactions  . Tramadol     Social History   Social History  . Marital Status: Married    Spouse Name: N/A  . Number of Children: N/A  . Years of Education: N/A   Occupational History  . Not on file.   Social History Main Topics  . Smoking status: Never Smoker   . Smokeless tobacco: Not on file  . Alcohol Use: 0.0 oz/week    0 Standard drinks or equivalent per week     Comment: rarely  . Drug Use: Not on file  . Sexual Activity: Not on file   Other Topics Concern  . 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 Diabetes Mother   . Heart disease Mother   . Hypertension Mother   . Diabetes Father     borderline  . Stroke      grandparent  . Breast cancer      grandmother  . Cancer Maternal Grandmother     breast    BP 172/85 mmHg  Pulse 112  Ht 5\' 8"  (1.727 m)  Wt  168 lb (76.204 kg)  BMI 25.55 kg/m2  Review of Systems: See HPI above.    Objective:  Physical Exam:  Gen: NAD  Back: No gross deformity, scoliosis. TTP right piriformis.  No midline or bony TTP. FROM. Strength LEs 5/5 all muscle groups.   2+ MSRs in patellar and achilles tendons, equal bilaterally. Negative SLRs. Sensation intact to light touch bilaterally.  Right hip: FROM Negative logroll Positive piriformis Negative fabers.    Assessment & Plan:  1. Right hip pain - 2/2 piriformis syndrome with sciatica.  Shown home exercises and stretches to do daily.  Prednisone dose pack.  Discussed tennis ball massage.  Consider imaging or PT if not improving as expected.

## 2015-05-22 NOTE — Assessment & Plan Note (Signed)
2/2 piriformis syndrome with sciatica.  Shown home exercises and stretches to do daily.  Prednisone dose pack.  Discussed tennis ball massage.  Consider imaging or PT if not improving as expected.

## 2015-05-28 MED ORDER — PREDNISONE 10 MG PO TABS: ORAL_TABLET | ORAL | Status: DC

## 2015-05-28 NOTE — Addendum Note (Signed)
Addended by: Lenda KelpHUDNALL, SHANE R on: 05/28/2015 03:01 PM   Modules accepted: Orders

## 2015-06-04 ENCOUNTER — Telehealth: Payer: Self-pay

## 2015-06-04 NOTE — Telephone Encounter (Signed)
SPoke with patient about upcoming appointment. Appointment confirmed states she would call me back to update chart.

## 2015-06-09 ENCOUNTER — Encounter: Payer: BC Managed Care – PPO | Admitting: Family Medicine

## 2015-06-17 ENCOUNTER — Telehealth: Payer: Self-pay | Admitting: Family Medicine

## 2015-06-17 NOTE — Telephone Encounter (Signed)
We discussed imaging of lumbar spine or physical therapy if she wasn't improving.

## 2015-07-08 ENCOUNTER — Other Ambulatory Visit: Payer: Self-pay | Admitting: Family Medicine

## 2015-07-08 NOTE — Telephone Encounter (Signed)
Medication filled to pharmacy as requested.   

## 2015-07-15 ENCOUNTER — Ambulatory Visit (INDEPENDENT_AMBULATORY_CARE_PROVIDER_SITE_OTHER): Payer: BC Managed Care – PPO | Admitting: Family Medicine

## 2015-07-15 ENCOUNTER — Encounter: Payer: Self-pay | Admitting: Family Medicine

## 2015-07-15 ENCOUNTER — Other Ambulatory Visit: Payer: Self-pay | Admitting: Family Medicine

## 2015-07-15 VITALS — BP 137/67 | HR 97 | Ht 68.0 in | Wt 165.0 lb

## 2015-07-15 DIAGNOSIS — M25551 Pain in right hip: Secondary | ICD-10-CM

## 2015-07-15 NOTE — Patient Instructions (Signed)
We will go ahead with a referral to [facility] at his [place] location. If not improving after 4-6 weeks of this call me and I'd consider physical therapy or imaging of your lumbar spine (x-rays then MRI). Follow up depends on how you do with chiropractic care.

## 2015-07-16 ENCOUNTER — Telehealth: Payer: Self-pay | Admitting: Family Medicine

## 2015-07-16 MED ORDER — PREDNISONE 10 MG PO TABS: ORAL_TABLET | ORAL | Status: DC

## 2015-07-16 NOTE — Assessment & Plan Note (Signed)
Exam still indicates likely piriformis syndrome with sciatica.  We discussed sometimes lumbar radiculopathy can present similarly.  She would like to try chiropractic care which I think is reasonable.  She also reports wanting to try prednisone again so sent this in.  Advised considering physical therapy, imaging of lumbar spine if still not improving over next 6 weeks.  F/u at that time.

## 2015-07-16 NOTE — Telephone Encounter (Signed)
Sorry I was under the impression she did not want to do this since she did not seem to improve from the first go around.  Please ask if she would like to try a 6 day dose pack or 12 and which pharmacy.  Thanks!

## 2015-07-16 NOTE — Progress Notes (Signed)
PCP and consultation requested by: Neena RhymesKatherine Tabori, MD  Subjective:   HPI: Patient is a 60 y.o. female here for low back pain into right leg.  05/21/15: Patient reports she's had about 2 weeks of worsening low back pain more in the buttock. Radiating down right leg posteriorly. No numbness or tingling. Comes and goes. Pain level up to 8/10 level. Has tried muscle relaxant, heat/ice. Also having left foot pain but this is much better - minimal pain currently. No skin changes, fever, other complaints.  07/15/15: Patient reports she continues to have fairly severe pain in low back going into right leg. No numbness or tingling. Pain level 6/10. Doing home exercises. Took prednisone - initial benefit then pain recurred. Interested in seeing chiropractor. No skin changes, fever, other complaints.  Past Medical History  Diagnosis Date  . Menopause     at [age]    Current Outpatient Prescriptions on File Prior to Visit  Medication Sig Dispense Refill  . CALCIUM PO Take by mouth.    Marland Kitchen. HYDROcodone-acetaminophen (NORCO) 5-325 MG tablet Take 1 tablet by mouth every 6 (six) hours as needed for moderate pain. 20 tablet 0  . meloxicam (MOBIC) 15 MG tablet TAKE 1 TABLET BY MOUTH EVERY DAY 30 tablet 1  . Multiple Vitamins-Minerals (MULTIVITAMINS) CHEW Chew 1 each by mouth daily.     No current facility-administered medications on file prior to visit.    Past Surgical History  Procedure Laterality Date  . Cholecystectomy    . Abdominal hysterectomy  2012    Allergies  Allergen Reactions  . Tramadol     Social History   Social History  . Marital Status: Married    Spouse Name: N/A  . Number of Children: N/A  . Years of Education: N/A   Occupational History  . Not on file.   Social History Main Topics  . Smoking status: Never Smoker   . Smokeless tobacco: Not on file  . Alcohol Use: 0.0 oz/week    0 Standard drinks or equivalent per week     Comment: rarely  . Drug Use:  Not on file  . Sexual Activity: Not on file   Other Topics Concern  . 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 Diabetes Mother   . Heart disease Mother   . Hypertension Mother   . Diabetes Father     borderline  . Stroke      grandparent  . Breast cancer      grandmother  . Cancer Maternal Grandmother     breast    BP 137/67 mmHg  Pulse 97  Ht 5\' 8"  (1.727 m)  Wt 165 lb (74.844 kg)  BMI 25.09 kg/m2  Review of Systems: See HPI above.    Objective:  Physical Exam:  Gen: NAD  Back: No gross deformity, scoliosis. TTP right piriformis.  No midline or bony TTP. FROM. Strength LEs 5/5 all muscle groups.   2+ MSRs in patellar and achilles tendons, equal bilaterally. Negative SLRs. Sensation intact to light touch bilaterally.  Right hip: FROM Negative logroll Positive piriformis Negative fabers.    Assessment & Plan:  1. Right hip pain - Exam still indicates likely piriformis syndrome with sciatica.  We discussed sometimes lumbar radiculopathy can present similarly.  She would like to try chiropractic care which I  think is reasonable.  She also reports wanting to try prednisone again so sent this in.  Advised considering physical therapy, imaging of lumbar spine if still not improving over next 6 weeks.  F/u at that time.

## 2015-07-16 NOTE — Telephone Encounter (Signed)
I had it in there but sent it again.  Ask her to call to make sure they have it before she goes to pick it up to be sure.

## 2015-07-17 NOTE — Telephone Encounter (Signed)
Spoke to patient on 07-16-15 and told her script had been re-sent.

## 2015-08-04 ENCOUNTER — Telehealth: Payer: Self-pay | Admitting: Family Medicine

## 2015-08-04 MED ORDER — IBUPROFEN 800 MG PO TABS: 800.0000 mg | ORAL_TABLET | Freq: Three times a day (TID) | ORAL | Status: DC | PRN

## 2015-08-04 NOTE — Telephone Encounter (Signed)
Ok to do the 800 ibuprofen - I sent this to the [facility] on [address].  Make sure she's not taking the meloxicam still also since they're in the same class.  Thanks!

## 2015-08-04 NOTE — Telephone Encounter (Signed)
Called patient and told her that the 800 mg ibuprofen has been sent to her pharmacy. Asked if still taking meloxicam and patient stated that she is not since she has finished the meloxicam.

## 2015-09-10 ENCOUNTER — Ambulatory Visit (INDEPENDENT_AMBULATORY_CARE_PROVIDER_SITE_OTHER): Payer: BC Managed Care – PPO | Admitting: Physician Assistant

## 2015-09-10 ENCOUNTER — Encounter: Payer: Self-pay | Admitting: Physician Assistant

## 2015-09-10 ENCOUNTER — Telehealth: Payer: Self-pay | Admitting: Physician Assistant

## 2015-09-10 VITALS — BP 106/59 | HR 96 | Temp 99.5°F | Ht 68.0 in | Wt 168.0 lb

## 2015-09-10 DIAGNOSIS — R1031 Right lower quadrant pain: Secondary | ICD-10-CM | POA: Insufficient documentation

## 2015-09-10 DIAGNOSIS — M79661 Pain in right lower leg: Secondary | ICD-10-CM | POA: Diagnosis not present

## 2015-09-10 DIAGNOSIS — R109 Unspecified abdominal pain: Secondary | ICD-10-CM

## 2015-09-10 DIAGNOSIS — R399 Unspecified symptoms and signs involving the genitourinary system: Secondary | ICD-10-CM | POA: Diagnosis not present

## 2015-09-10 DIAGNOSIS — D72829 Elevated white blood cell count, unspecified: Secondary | ICD-10-CM

## 2015-09-10 LAB — CBC WITH DIFFERENTIAL/PLATELET
BASOS ABS: 0 10*3/uL (ref 0.0–0.1)
Basophils Relative: 0.2 % (ref 0.0–3.0)
EOS PCT: 1.1 % (ref 0.0–5.0)
Eosinophils Absolute: 0.2 10*3/uL (ref 0.0–0.7)
HEMATOCRIT: 33.5 % — AB (ref 36.0–46.0)
HEMOGLOBIN: 11.1 g/dL — AB (ref 12.0–15.0)
LYMPHS PCT: 14.2 % (ref 12.0–46.0)
Lymphs Abs: 2 10*3/uL (ref 0.7–4.0)
MCHC: 33.1 g/dL (ref 30.0–36.0)
MCV: 90.7 fl (ref 78.0–100.0)
MONOS PCT: 11.5 % (ref 3.0–12.0)
Monocytes Absolute: 1.6 10*3/uL — ABNORMAL HIGH (ref 0.1–1.0)
NEUTROS PCT: 73 % (ref 43.0–77.0)
Neutro Abs: 10.2 10*3/uL — ABNORMAL HIGH (ref 1.4–7.7)
Platelets: 196 10*3/uL (ref 150.0–400.0)
RBC: 3.69 Mil/uL — AB (ref 3.87–5.11)
RDW: 14.4 % (ref 11.5–15.5)
WBC: 13.9 10*3/uL — AB (ref 4.0–10.5)

## 2015-09-10 LAB — POC URINALSYSI DIPSTICK (AUTOMATED)
BILIRUBIN UA: NEGATIVE
GLUCOSE UA: NEGATIVE
Ketones, UA: NEGATIVE
NITRITE UA: NEGATIVE
PH UA: 6
Spec Grav, UA: 1.025
Urobilinogen, UA: 0.2

## 2015-09-10 MED ORDER — MELOXICAM 7.5 MG PO TABS: 7.5000 mg | ORAL_TABLET | Freq: Two times a day (BID) | ORAL | Status: DC

## 2015-09-10 MED ORDER — CIPROFLOXACIN HCL 500 MG PO TABS: 500.0000 mg | ORAL_TABLET | Freq: Two times a day (BID) | ORAL | Status: DC

## 2015-09-10 MED ORDER — MELOXICAM 15 MG PO TABS: 15.0000 mg | ORAL_TABLET | Freq: Every day | ORAL | Status: DC

## 2015-09-10 NOTE — Assessment & Plan Note (Signed)
Urine dip + blood and 3+ LE. Will send for culture. Will treat with Cipro 500 mg BID x 5 days pending culture results.

## 2015-09-10 NOTE — Assessment & Plan Note (Signed)
No swelling or edema. Negative Homan sign. Wells DVT criteria reviewed. Score is 0. Suspect muscular in nature. Rx mobic. Supportive measures reviewed. Follow-up with Dr. Pearletha Forge.

## 2015-09-10 NOTE — Progress Notes (Signed)
Patient presents to clinic today c/o R sided lower back and suprapubic pain with nausea noted on Friday. Pain had resolved but returned since this morning. Denies trauma or injury. Has been seeing a chiropractor for scoliosis and hip pain. Denies dysuria. Notes urgency and frequency. States she felt like she was getting a UTI last week and self-medicated with cranberry juice and fluids.   Endorses also pain in R calf that is intermittent and worse with prolonged standing. Denies calf swelling. Denies recent surgery or prolonged immobilization.  Past Medical History  Diagnosis Date  . Menopause     at 86    Current Outpatient Prescriptions on File Prior to Visit  Medication Sig Dispense Refill  . CALCIUM PO Take by mouth.    Marland Kitchen ibuprofen (ADVIL,MOTRIN) 800 MG tablet Take 1 tablet (800 mg total) by mouth every 8 (eight) hours as needed. 90 tablet 1  . Multiple Vitamins-Minerals (MULTIVITAMINS) CHEW Chew 1 each by mouth daily.     No current facility-administered medications on file prior to visit.    Allergies  Allergen Reactions  . Tramadol     Family History  Problem Relation Age of Onset  . Diabetes Mother   . Heart disease Mother   . Hypertension Mother   . Diabetes Father     borderline  . Stroke      grandparent  . Breast cancer      grandmother  . Cancer Maternal Grandmother     breast    Social History   Social History  . Marital Status: Married    Spouse Name: N/A  . Number of Children: N/A  . Years of Education: N/A   Social History Main Topics  . Smoking status: Never Smoker   . Smokeless tobacco: None  . Alcohol Use: 0.0 oz/week    0 Standard drinks or equivalent per week     Comment: rarely  . Drug Use: None  . Sexual Activity: Not Asked   Other Topics Concern  . 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  All other ROS are  negative.  BP 106/59 mmHg  Pulse 96  Temp(Src) 99.5 F (37.5 C) (Oral)  Ht  (1.727 m)  Wt 168 lb (76.204 kg)  BMI 25.55 kg/m2  SpO2 100%  Physical Exam  Constitutional: She is oriented to person, place, and time and well-developed, well-nourished, and in no distress.  HENT:  Head: Normocephalic and atraumatic.  Cardiovascular: Normal rate, regular rhythm, normal heart sounds and intact distal pulses.   Pulmonary/Chest: Effort normal and breath sounds normal. No respiratory distress. She has no wheezes. She has no rales. She exhibits no tenderness.  Abdominal: Soft. Bowel sounds are normal. There is no tenderness. There is no guarding, no CVA tenderness and no tenderness at McBurney's point.  Negative psoas sign  Neurological: She is alert and oriented to person, place, and time.  Skin: Skin is warm and dry. No rash noted.  Psychiatric: Affect normal.  Vitals reviewed.  Recent Results (from the past 2160 hour(s))  CBC w/Diff     Status: Abnormal   Collection Time: 09/10/15  3:53 PM  Result Value Ref Range   WBC 13.9 (H) 4.0 - 10.5 K/uL   RBC 3.69 (L) 3.87 - 5.11 Mil/uL   Hemoglobin 11.1 (L) 12.0 - 15.0 g/dL   HCT 09.8 (L)  36.0 - 46.0 %   MCV 90.7 78.0 - 100.0 fl   MCHC 33.1 30.0 - 36.0 g/dL   RDW 96.0 45.4 - 09.8 %   Platelets 196.0 150.0 - 400.0 K/uL   Neutrophils Relative % 73.0 43.0 - 77.0 %   Lymphocytes Relative 14.2 12.0 - 46.0 %   Monocytes Relative 11.5 3.0 - 12.0 %   Eosinophils Relative 1.1 0.0 - 5.0 %   Basophils Relative 0.2 0.0 - 3.0 %   Neutro Abs 10.2 (H) 1.4 - 7.7 K/uL   Lymphs Abs 2.0 0.7 - 4.0 K/uL   Monocytes Absolute 1.6 (H) 0.1 - 1.0 K/uL   Eosinophils Absolute 0.2 0.0 - 0.7 K/uL   Basophils Absolute 0.0 0.0 - 0.1 K/uL  POCT Urinalysis Dipstick (Automated)     Status: Abnormal   Collection Time: 09/10/15  5:06 PM  Result Value Ref Range   Color, UA yellow    Clarity, UA cloudy    Glucose, UA neg    Bilirubin, UA neg    Ketones, UA neg     Spec Grav, UA 1.025    Blood, UA 1+    pH, UA 6.0    Protein, UA 1+    Urobilinogen, UA 0.2    Nitrite, UA neg    Leukocytes, UA large (3+) (A) Negative    Assessment/Plan: UTI symptoms Urine dip + blood and 3+ LE. Will send for culture. Will treat with Cipro 500 mg BID x 5 days pending culture results.  RLQ abdominal pain Negative tenderness with palpation. No guarding or rebound tenderness. Negative McBurney point and psoas maneuver. There is some pain with lateral bending. Patient with UTI symptoms and abnormal dip. We are treating with Cipro. STAT CBC obtained to assess her pain further giving benign exam. WBC at 14 with increase in abs neutrophils. Order for stat CT placed. Patient refuses to have imaging tonight. Will schedule first thing tomorrow morning. She refuses ER assessment but promises if symptoms worsen at all she will go.  Calf pain No swelling or edema. Negative Homan sign. Wells DVT criteria reviewed. Score is 0. Suspect muscular in nature. Rx mobic. Supportive measures reviewed. Follow-up with Dr. Pearletha Forge.

## 2015-09-10 NOTE — Patient Instructions (Signed)
For the calf -- it seems that you may have a mild strain. Take the Mobic as directed with food.  Apply Aspercreme as directed. Go easy on chiropractor visits until this calms down.  Your abdominal exam is good. Your pain is mainly in lower back. You have evidence of a UTI on your urine testing.  Your symptoms are consistent with a bladder infection, also called acute cystitis. Please take your antibiotic (Cipro) as directed until all pills are gone.  Stay very well hydrated.  Consider a daily probiotic (Align, Culturelle, or Activia) to help prevent stomach upset caused by the antibiotic.  Taking a probiotic daily may also help prevent recurrent UTIs.  Also consider taking AZO (Phenazopyridine) tablets to help decrease pain with urination.  I will call you with your urine testing results.  We will change antibiotics if indicated.  Call or return to clinic if symptoms are not resolved by completion of antibiotic.   If anything worsens overnight, I want you to go to the ER. I will call with your results. If I see anything worrisome for a larger infection, I will tell you to proceed to the ER at that time.  Urinary Tract Infection A urinary tract infection (UTI) can occur any place along the urinary tract. The tract includes the kidneys, ureters, bladder, and urethra. A type of germ called bacteria often causes a UTI. UTIs are often helped with antibiotic medicine.  HOME CARE   If given, take antibiotics as told by your doctor. Finish them even if you start to feel better.  Drink enough fluids to keep your pee (urine) clear or pale yellow.  Avoid tea, drinks with caffeine, and bubbly (carbonated) drinks.  Pee often. Avoid holding your pee in for a long time.  Pee before and after having sex (intercourse).  Wipe from front to back after you poop (bowel movement) if you are a woman. Use each tissue only once. GET HELP RIGHT AWAY IF:   You have back pain.  You have lower belly (abdominal)  pain.  You have chills.  You feel sick to your stomach (nauseous).  You throw up (vomit).  Your burning or discomfort with peeing does not go away.  You have a fever.  Your symptoms are not better in 3 days. MAKE SURE YOU:   Understand these instructions.  Will watch your condition.  Will get help right away if you are not doing well or get worse. Document Released: 12/15/2007 Document Revised: 03/22/2012 Document Reviewed: 01/27/2012 [facility] Patient Information 2015 [place], [place]. This information is not intended to replace advice given to you by your health care provider. Make sure you discuss any questions you have with your health care provider.

## 2015-09-10 NOTE — Progress Notes (Signed)
Pre visit review using our clinic review tool, if applicable. No additional management support is needed unless otherwise documented below in the visit note. 

## 2015-09-10 NOTE — Assessment & Plan Note (Signed)
Negative tenderness with palpation. No guarding or rebound tenderness. Negative McBurney point and psoas maneuver. There is some pain with lateral bending. Patient with UTI symptoms and abnormal dip. We are treating with Cipro. STAT CBC obtained to assess her pain further giving benign exam. WBC at 14 with increase in abs neutrophils. Order for stat CT placed. Patient refuses to have imaging tonight. Will schedule first thing tomorrow morning. She refuses ER assessment but promises if symptoms worsen at all she will go.

## 2015-09-10 NOTE — Telephone Encounter (Signed)
Leukocytosis noted. Concern more acute abdominal issue -- potentially developing appendicitis despite relatively benign exam.  Spoke to patient concerning this -- refuses ER assessment. Agrees to Ct scan. STAT CT ordered will be scheduled in the morning as she is refusing to go tonight. She has agreed to go to the ER if anything worsens.  Will inform PCP   FYI -- Candise Bowens and Marj -- can we schedule ASAP tomorrow morning. Thank you.

## 2015-09-11 ENCOUNTER — Ambulatory Visit (HOSPITAL_BASED_OUTPATIENT_CLINIC_OR_DEPARTMENT_OTHER)
Admission: RE | Admit: 2015-09-11 | Discharge: 2015-09-11 | Disposition: A | Discharge status: Home / Self Care | Payer: BC Managed Care – PPO | Source: Ambulatory Visit | Attending: Family Medicine | Admitting: Family Medicine

## 2015-09-11 ENCOUNTER — Encounter (HOSPITAL_BASED_OUTPATIENT_CLINIC_OR_DEPARTMENT_OTHER): Payer: Self-pay

## 2015-09-11 ENCOUNTER — Other Ambulatory Visit: Payer: Self-pay | Admitting: Family Medicine

## 2015-09-11 DIAGNOSIS — R1031 Right lower quadrant pain: Secondary | ICD-10-CM

## 2015-09-11 DIAGNOSIS — N12 Tubulo-interstitial nephritis, not specified as acute or chronic: Secondary | ICD-10-CM | POA: Insufficient documentation

## 2015-09-11 DIAGNOSIS — Z90710 Acquired absence of both cervix and uterus: Secondary | ICD-10-CM | POA: Insufficient documentation

## 2015-09-11 LAB — COMPREHENSIVE METABOLIC PANEL
ALBUMIN: 3.8 g/dL (ref 3.5–5.2)
ALK PHOS: 64 U/L (ref 39–117)
ALT: 25 U/L (ref 0–35)
AST: 29 U/L (ref 0–37)
BUN: 20 mg/dL (ref 6–23)
CHLORIDE: 104 meq/L (ref 96–112)
CO2: 28 mEq/L (ref 19–32)
CREATININE: 1.07 mg/dL (ref 0.40–1.20)
Calcium: 9.3 mg/dL (ref 8.4–10.5)
GFR: 55.85 mL/min — ABNORMAL LOW (ref >60.00)
GLUCOSE: 102 mg/dL — AB (ref 70–99)
POTASSIUM: 4.5 meq/L (ref 3.5–5.1)
SODIUM: 138 meq/L (ref 135–145)
TOTAL PROTEIN: 6.7 g/dL (ref 6.0–8.3)
Total Bilirubin: 0.4 mg/dL (ref 0.2–1.2)

## 2015-09-11 MED ORDER — IOHEXOL 300 MG/ML  SOLN
100.0000 mL | Freq: Once | INTRAMUSCULAR | Status: AC | PRN
  Administered 2015-09-11: 100 mL via INTRAVENOUS

## 2015-09-12 ENCOUNTER — Telehealth: Payer: Self-pay | Admitting: *Deleted

## 2015-09-12 DIAGNOSIS — R1031 Right lower quadrant pain: Secondary | ICD-10-CM

## 2015-09-12 MED ORDER — CIPROFLOXACIN HCL 500 MG PO TABS: 500.0000 mg | ORAL_TABLET | Freq: Two times a day (BID) | ORAL | Status: DC

## 2015-09-12 NOTE — Telephone Encounter (Signed)
-----   Message from Waldon MerlWilliam C Martin, PA-C sent at 09/12/2015  9:50 AM EST ----- Please call patient to let her know we are extending her Ciprofloxacin for 5 more days (10-day total). Ok to refill med for 5 days (Quanity 10)

## 2015-09-12 NOTE — Telephone Encounter (Signed)
Called and spoke with the pt and informed her of the note below.  Pt verbalized understanding and agreed.  Rx refilled and sent to the pharmacy by e-script//AB/CMA

## 2015-09-13 LAB — CULTURE, URINE COMPREHENSIVE: Colony Count: >=100000

## 2015-09-25 ENCOUNTER — Telehealth: Payer: Self-pay | Admitting: Family Medicine

## 2015-09-26 NOTE — Telephone Encounter (Signed)
Spoke to patient and told her we would refer to physical therapy. Patient stated that she is still in a lot of pain. Taking meloxicam but is not taking the ibuprofen 800 mg. Wants to know what to do for the pain.

## 2015-09-26 NOTE — Telephone Encounter (Signed)
She can continue the meloxicam, something topical like capsaicin or biofreeze.  She can take tylenol in addition to this.  We could prescribe her a muscle relaxant or a short course of a pain medication as well if she would prefer.

## 2015-09-29 MED ORDER — HYDROCODONE-ACETAMINOPHEN 5-325 MG PO TABS: 1.0000 | ORAL_TABLET | Freq: Four times a day (QID) | ORAL | Status: DC | PRN

## 2015-09-29 NOTE — Telephone Encounter (Signed)
I would not recommend this.  [name] done 3 courses of prednisone the past few months.

## 2015-09-29 NOTE — Telephone Encounter (Signed)
Spoke to patient and she would like to do the short course of pain medication.

## 2015-09-29 NOTE — Addendum Note (Signed)
Addended by: Kathi SimpersWISE, Garrett Mitchum F on: 09/29/2015 08:50 AM   Modules accepted: Orders

## 2015-09-29 NOTE — Telephone Encounter (Signed)
Spoke to patient and she would like to do Prednisone if possible for the pain.

## 2015-10-01 NOTE — Telephone Encounter (Signed)
Norco prescribed and placed up front for patient to pick up.

## 2015-10-10 ENCOUNTER — Encounter: Payer: BC Managed Care – PPO | Admitting: Family Medicine

## 2015-10-13 ENCOUNTER — Ambulatory Visit: Payer: BC Managed Care – PPO | Admitting: Physical Therapy

## 2015-10-27 ENCOUNTER — Ambulatory Visit: Payer: BC Managed Care – PPO | Attending: Family Medicine | Admitting: Physical Therapy

## 2015-10-27 ENCOUNTER — Encounter: Payer: Self-pay | Admitting: Physical Therapy

## 2015-10-27 DIAGNOSIS — M5441 Lumbago with sciatica, right side: Secondary | ICD-10-CM

## 2015-10-27 NOTE — Therapy (Signed)
[facility] [address] [place], [place], [address] Phone: [phone]   Fax:  [phone]  Physical Therapy Evaluation  Patient Details  Name: Alison Anderson MRN: [id number] Date of Birth: Aug 29, 1955 Referring Provider: Dr. Woodroe Chen  Encounter Date: 10/27/2015      PT End of Session - 10/27/15 1636    Visit Number 1   Date for PT Re-Evaluation 12/27/15   PT Start Time 1600   PT Stop Time 1650   PT Time Calculation (min) 50 min   Activity Tolerance Patient tolerated treatment well   Behavior During Therapy [facility] for tasks assessed/performed      Past Medical History  Diagnosis Date  . Menopause     at 54    Past Surgical History  Procedure Laterality Date  . Cholecystectomy    . Abdominal hysterectomy  2012    There were no vitals filed for this visit.       Subjective Assessment - 10/27/15 1603    Subjective Patient reports right hip and low back pain for a number of months.  Reports that she saw a chiropractor and had some mild relief.  Has some scoliosis.   Limitations Sitting;Lifting   Patient Stated Goals have less pain   Currently in Pain? Yes   Pain Score 8    Pain Location Hip   Pain Orientation Right;Posterior   Pain Descriptors / Indicators Aching;Sore   Pain Type Chronic pain   Pain Onset More than a month ago   Pain Frequency Constant   Aggravating Factors  bein up on her feet will increase to 9/10   Pain Relieving Factors some heat helps, 4/10 at best   Effect of Pain on Daily Activities limits walking and standing            [facility] PT Assessment - 10/27/15 0001    Assessment   Medical Diagnosis right hip pain   Referring Provider Dr. Woodroe Chen   Onset Date/Surgical Date 09/26/15   Precautions   Precautions None   Balance Screen   Has the patient fallen in the past 6 months No   Has the patient had a decrease in activity level because of a fear of falling?  No   Is the patient  reluctant to leave their home because of a fear of falling?  No   Home Environment   Additional Comments housework   Prior Function   Level of Independence Independent   Vocation Full time employment   Occupational hygienist   Leisure no exercise   ROM / Strength   AROM / PROM / Strength AROM;Strength   AROM   Overall AROM Comments Lumbar ROM was decreased 50% with pain   Strength   Overall Strength Comments 4-/5 for the LE's some increase of pain in the right hip   Flexibility   Soft Tissue Assessment /Muscle Length --  tight piriformis, ITB and HS   Palpation   Palpation comment very tender in the right buttock, tightness in the lumbar parapainals.                   [facility] Adult PT Treatment/Exercise - 10/27/15 0001    Modalities   Modalities Electrical Stimulation;Moist Heat   Moist Heat Therapy   Number Minutes Moist Heat 15 Minutes   Moist Heat Location Lumbar Spine   Electrical Stimulation   Electrical Stimulation Location Right buttock   Electrical Stimulation Action IFC  Electrical Stimulation Parameters supine   Electrical Stimulation Goals Pain                PT Education - 10/27/15 1635    Education provided Yes   Education Details WM's flexion and piriformis stretches   Person(s) Educated Patient   Methods Explanation;Demonstration;Handout   Comprehension Verbalized understanding;Returned demonstration          PT Short Term Goals - 10/27/15 1639    PT SHORT TERM GOAL #1   Title independent with initial HEP   Time 2   Period Weeks   Status New           PT Long Term Goals - 10/27/15 1639    PT LONG TERM GOAL #1   Title understand proper posture and body mechanics   Time 8   Period Weeks   Status New   PT LONG TERM GOAL #2   Title increase lumbar ROM 25%   Time 8   Period Weeks   Status New   PT LONG TERM GOAL #3   Title decrease pain 50%   Time 8   Period Weeks   Status New   PT LONG TERM GOAL  #4   Title increase LE strength to 4+/5   Time 8   Period Weeks   Status New               Plan - 10/27/15 1636    Clinical Impression Statement Patient with right sciatica type pain, she has had the pain for a few months.  She has very tight HS, ITB and piriformis mms.  Lumbar ROM is decreased 50% with some right buttock pain, has spasms in the lumbar area   Rehab Potential Good   PT Frequency 2x / week   PT Duration 8 weeks   PT Treatment/Interventions ADLs/Self Care Home Management;Electrical Stimulation;Moist Heat;Therapeutic exercise;Therapeutic activities;Functional mobility training;Ultrasound;Traction;Neuromuscular re-education;Patient/family education;Manual techniques   PT Next Visit Plan Patient with a 30% co-insurance, she would benefit from PT to add gym exercises and flexibility   Consulted and Agree with Plan of Care Patient      Patient will benefit from skilled therapeutic intervention in order to improve the following deficits and impairments:  Decreased range of motion, Decreased strength, Increased fascial restricitons, Increased muscle spasms, Impaired flexibility, Postural dysfunction, Improper body mechanics, Pain  Visit Diagnosis: Right-sided low back pain with right-sided sciatica - Plan: PT plan of care cert/re-cert     Problem List Patient Active Problem List   Diagnosis Date Noted  . UTI symptoms 09/10/2015  . RLQ abdominal pain 09/10/2015  . Calf pain 09/10/2015  . Right hip pain 05/22/2015  . Left foot pain 05/12/2015  . Acute bacterial bronchitis 03/12/2015  . Physical exam 06/05/2014  . Lumbar paraspinal muscle spasm 06/15/2013  . Leg pain 02/23/2011  . HAND PAIN, RIGHT 08/22/2009  . OTHER DISEASES OF NASAL CAVITY AND SINUSES 08/07/2008    Jearld LeschALBRIGHT,Kyson Kupper W., PT 10/27/2015, 4:52 PM  [facility] [address] [place], [place], [address] Phone: [phone]   Fax:   [phone]  Name: Alison Anderson MRN: [id number] Date of Birth: 07-02-1956

## 2015-11-04 ENCOUNTER — Ambulatory Visit: Payer: BC Managed Care – PPO | Admitting: Physical Therapy

## 2015-11-04 ENCOUNTER — Encounter: Payer: Self-pay | Admitting: Physical Therapy

## 2015-11-04 DIAGNOSIS — M5441 Lumbago with sciatica, right side: Secondary | ICD-10-CM | POA: Diagnosis not present

## 2015-11-04 NOTE — Therapy (Addendum)
[address], [place], [address] Phone: [phone]   Fax:  [phone]  Physical Therapy Treatment  Patient Details  Name: Alison Anderson MRN: [id number] Date of Birth: [birth year] Referring Provider: Dr. Bennye Alm  Encounter Date: 11/04/2015    Past Medical History  Diagnosis Date  . Menopause     at 25    Past Surgical History  Procedure Laterality Date  . Cholecystectomy    . Abdominal hysterectomy  2012    There were no vitals filed for this visit.                                 PT Short Term Goals - 11/04/15 1643    PT SHORT TERM GOAL #1   Title independent with initial HEP   Status Achieved           PT Long Term Goals - 10/27/15 1639    PT LONG TERM GOAL #1   Title understand proper posture and body mechanics   Time 8   Period Weeks   Status New   PT LONG TERM GOAL #2   Title increase lumbar ROM 25%   Time 8   Period Weeks   Status New   PT LONG TERM GOAL #3   Title decrease pain 50%   Time 8   Period Weeks   Status New   PT LONG TERM GOAL #4   Title increase LE strength to 4+/5   Time 8   Period Weeks   Status New             Patient will benefit from skilled therapeutic intervention in order to improve the following deficits and impairments:     Visit Diagnosis: Right-sided low back pain with right-sided sciatica     Problem List Patient Active Problem List   Diagnosis Date Noted  . UTI symptoms 09/10/2015  . RLQ abdominal pain 09/10/2015  . Calf pain 09/10/2015  . Right hip pain 05/22/2015  . Left foot pain 05/12/2015  . Acute bacterial bronchitis 03/12/2015  . Physical exam 06/05/2014  . Lumbar paraspinal muscle spasm 06/15/2013  . Leg pain 02/23/2011  . HAND PAIN, RIGHT 08/22/2009  . OTHER DISEASES OF NASAL CAVITY AND SINUSES 08/07/2008    Sumner Boast., PT 12/02/2015, 1:54 PM  [address] [place], [place], [address] Phone: [phone]   Fax:  [phone]  Name: Alison Anderson MRN: [id number] Date of Birth: [birth year]    PHYSICAL THERAPY DISCHARGE SUMMARY   Plan: Patient agrees to discharge.  Patient goals were not met. Patient is being discharged due to financial reasons.  ?????   Lum Babe, PT

## 2015-11-07 ENCOUNTER — Other Ambulatory Visit: Payer: Self-pay | Admitting: General Practice

## 2015-11-07 MED ORDER — MELOXICAM 15 MG PO TABS: 15.0000 mg | ORAL_TABLET | Freq: Every day | ORAL | Status: DC

## 2015-11-17 ENCOUNTER — Ambulatory Visit: Payer: BC Managed Care – PPO | Admitting: Physical Therapy

## 2015-11-19 ENCOUNTER — Telehealth: Payer: Self-pay | Admitting: Family Medicine

## 2015-11-19 NOTE — Telephone Encounter (Signed)
Please advise per phone note from Dr. Pearletha ForgeHudnall on 09/26/15 he declined to give patient any prednisone due to her being on it for so long.

## 2015-11-19 NOTE — Telephone Encounter (Signed)
Patient notified of PCP recommendations and is agreement and expresses an understanding.  

## 2015-11-19 NOTE — Telephone Encounter (Signed)
Pt states that she is taking 3 hydrocodone a day for pain and states that this is not working as well as the prednisone that she got from Dr Beverely Lowabori. She said she understands that she is not to take this again, however this worked for her. She also states that meloxicam is not working. Pt was talking really fast and hard to understand at times.

## 2015-11-19 NOTE — Telephone Encounter (Signed)
I would recommend that she discuss further tx w/ Dr Pearletha ForgeHudnall since he has been managing this issue for her.  She is also in PT and can discuss this w/ them and see what they would recommend.  I can refer her for a 2nd opinion if she would like but I am not able to prescribe prednisone at this time since I am not the managing MD for this problem.

## 2015-11-24 NOTE — Telephone Encounter (Signed)
Gunnar FusiPaula - please see my staff message to you addressing questions she called about.  Thanks!

## 2015-11-27 NOTE — Telephone Encounter (Signed)
Spoke with patient and addressed all questions.

## 2015-12-04 ENCOUNTER — Encounter: Payer: Self-pay | Admitting: Family Medicine

## 2015-12-04 ENCOUNTER — Ambulatory Visit (INDEPENDENT_AMBULATORY_CARE_PROVIDER_SITE_OTHER): Payer: BC Managed Care – PPO | Admitting: Family Medicine

## 2015-12-04 VITALS — BP 125/81 | HR 94 | Ht 68.0 in | Wt 160.0 lb

## 2015-12-04 DIAGNOSIS — M5441 Lumbago with sciatica, right side: Secondary | ICD-10-CM | POA: Diagnosis not present

## 2015-12-04 MED ORDER — HYDROCODONE-ACETAMINOPHEN 7.5-325 MG PO TABS: 1.0000 | ORAL_TABLET | Freq: Four times a day (QID) | ORAL | Status: DC | PRN

## 2015-12-04 MED FILL — HYDROCODON-APAP 7.5-325: 7.5-325 | 10 days supply | Qty: 40 | Fill #0

## 2015-12-04 NOTE — Patient Instructions (Addendum)
Try to get copies of your x-rays so we can get approval for your MRI. Norco as needed for severe pain. Ok to take the muscle relaxant as well. The MRI will determine our next steps. [facility] [place] [place] [place] here on [place]aturdays, in [place] on Mondays (will be Tuesday because of the holiday though).

## 2015-12-09 DIAGNOSIS — M5441 Lumbago with sciatica, right side: Secondary | ICD-10-CM | POA: Insufficient documentation

## 2015-12-09 NOTE — Progress Notes (Addendum)
PCP and consultation requested by: Alison RhymesKatherine Tabori, MD  Subjective:   HPI: Patient is a [age] y.o. female here for low back pain into right leg.  05/21/15: Patient reports she's had about 2 weeks of worsening low back pain more in the buttock. Radiating down right leg posteriorly. No numbness or tingling. Comes and goes. Pain level up to 8/10 level. Has tried muscle relaxant, heat/ice. Also having left foot pain but this is much better - minimal pain currently. No skin changes, fever, other complaints.  07/15/15: Patient reports she continues to have fairly severe pain in low back going into right leg. No numbness or tingling. Pain level 6/10. Doing home exercises. Took prednisone - initial benefit then pain recurred. Interested in seeing chiropractor. No skin changes, fever, other complaints.  12/04/15: Patient reports she's really been struggling with pain in low back and into right leg. Pain level 10/10, sharp. Had pyelonephritis about 1 month ago also, ? Strained her back on right side around this time too. No bowel/bladder dysfunction. Constant pain regardless of if shes standing or walking. Tried some of her husband's prednisone in addition to courses she has had in the past. No numbness or tingling.  Past Medical History  Diagnosis Date  . Menopause     at [age]    Current Outpatient Prescriptions on File Prior to Visit  Medication Sig Dispense Refill  . CALCIUM PO Take by mouth.    . ciprofloxacin (CIPRO) 500 MG tablet Take 1 tablet (500 mg total) by mouth 2 (two) times daily. 10 tablet 0  . ibuprofen (ADVIL,MOTRIN) 800 MG tablet Take 1 tablet (800 mg total) by mouth every 8 (eight) hours as needed. 90 tablet 1  . meloxicam (MOBIC) 15 MG tablet Take 1 tablet (15 mg total) by mouth daily. 30 tablet 0  . Multiple Vitamins-Minerals (MULTIVITAMINS) CHEW Chew 1 each by mouth daily.     No current facility-administered medications on file prior to visit.    Past Surgical  History  Procedure Laterality Date  . Cholecystectomy    . Abdominal hysterectomy  2012    Allergies  Allergen Reactions  . Tramadol     Social History   Social History  . Marital Status: Married    Spouse Name: N/A  . Number of Children: N/A  . Years of Education: N/A   Occupational History  . Not on file.   Social History Main Topics  . Smoking status: Never Smoker   . Smokeless tobacco: Not on file  . Alcohol Use: 0.0 oz/week    0 Standard drinks or equivalent per week     Comment: rarely  . Drug Use: Not on file  . Sexual Activity: Not on file   Other Topics Concern  . 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 Diabetes Mother   . Heart disease Mother   . Hypertension Mother   . Diabetes Father     borderline  . Stroke      grandparent  . Breast cancer      grandmother  . Cancer Maternal Grandmother     breast    BP 125/81 mmHg  Pulse 94  Ht 5\' 8"  (1.727 m)  Wt 160 lb (72.576 kg)  BMI 24.33 kg/m2  Review of Systems: See HPI above.    Objective:  Physical Exam:  Gen: NAD  Back: No gross deformity, scoliosis. TTP right lumbar paraspinal region.  No midline or bony TTP. FROM. Strength LEs 5/5 all muscle groups.   2+ MSRs in patellar and achilles tendons, equal bilaterally. Negative SLRs. Sensation intact to light touch bilaterally.  Right hip: FROM Negative logroll Negative piriformis Negative fabers.    Assessment & Plan:  1. Low back pain - with radiation into right leg.  Very severe pain and has not responded to physical therapy (could only do 2 visits for financial reasons), home exercises, steroid dose packs, chiropractic care.  I'm concerned about lumbar radiculopathy at this point.  She will get copies of x-rays from chiropractor to bring to [place] to review then will go ahead with MRI.  Norco,  flexeril as needed in meantime.  Addendum:  MRI reviewed and discussed with patient.  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  We will try injection at this level - will defer approach to interventional radiology.  Advised her to call us a week following this to let us know how she's doing.

## 2015-12-09 NOTE — Assessment & Plan Note (Signed)
with radiation into right leg.  Very severe pain and has not responded to physical therapy (could only do 2 visits for financial reasons), home exercises, steroid dose packs, chiropractic care.  I'm concerned about lumbar radiculopathy at this point.  She will get copies of x-rays from chiropractor to bring to us to review then will go ahead with MRI.  Norco, flexeril as needed in meantime.

## 2015-12-10 ENCOUNTER — Ambulatory Visit: Payer: BC Managed Care – PPO | Admitting: Family Medicine

## 2015-12-11 ENCOUNTER — Telehealth: Payer: Self-pay | Admitting: Family Medicine

## 2015-12-11 NOTE — Telephone Encounter (Signed)
Yes Gunnar Fusiaula is working on scheduling an MRI as we had discussed.  Her x-rays just showed very mild arthritis but nothing there to account for her pain.

## 2015-12-11 NOTE — Addendum Note (Signed)
Addended by: Kathi SimpersWISE, Ambriel Gorelick F on: 12/11/2015 03:13 PM   Modules accepted: Orders

## 2015-12-12 NOTE — Telephone Encounter (Signed)
Order placed and patient scheduled.

## 2015-12-13 ENCOUNTER — Ambulatory Visit (HOSPITAL_BASED_OUTPATIENT_CLINIC_OR_DEPARTMENT_OTHER)
Admission: RE | Admit: 2015-12-13 | Discharge: 2015-12-13 | Disposition: A | Discharge status: Home / Self Care | Payer: BC Managed Care – PPO | Source: Ambulatory Visit | Attending: Family Medicine | Admitting: Family Medicine

## 2015-12-13 DIAGNOSIS — M8938 Hypertrophy of bone, other site: Secondary | ICD-10-CM | POA: Diagnosis not present

## 2015-12-13 DIAGNOSIS — M5126 Other intervertebral disc displacement, lumbar region: Secondary | ICD-10-CM | POA: Insufficient documentation

## 2015-12-13 DIAGNOSIS — M5441 Lumbago with sciatica, right side: Secondary | ICD-10-CM

## 2015-12-13 DIAGNOSIS — M4806 Spinal stenosis, lumbar region: Secondary | ICD-10-CM | POA: Diagnosis not present

## 2015-12-13 DIAGNOSIS — M5136 Other intervertebral disc degeneration, lumbar region: Secondary | ICD-10-CM | POA: Diagnosis not present

## 2015-12-13 DIAGNOSIS — M5127 Other intervertebral disc displacement, lumbosacral region: Secondary | ICD-10-CM | POA: Diagnosis not present

## 2015-12-15 ENCOUNTER — Telehealth: Payer: Self-pay | Admitting: Family Medicine

## 2015-12-16 ENCOUNTER — Other Ambulatory Visit: Payer: Self-pay | Admitting: Family Medicine

## 2015-12-16 ENCOUNTER — Telehealth: Payer: Self-pay | Admitting: Family Medicine

## 2015-12-16 DIAGNOSIS — M545 Low back pain, unspecified: Secondary | ICD-10-CM

## 2015-12-16 DIAGNOSIS — G8929 Other chronic pain: Secondary | ICD-10-CM

## 2015-12-16 NOTE — Telephone Encounter (Signed)
They will call her to schedule.  Please let her know Alison Anderson.

## 2015-12-19 NOTE — Telephone Encounter (Signed)
Spoke to patient. Patient stated that she would wait until the appointment date, which was the first available to have the injection done.

## 2015-12-23 ENCOUNTER — Telehealth: Payer: Self-pay | Admitting: Family Medicine

## 2015-12-23 NOTE — Telephone Encounter (Signed)
Spoke to patient and gave her the information she needed.

## 2015-12-24 ENCOUNTER — Ambulatory Visit
Admission: RE | Admit: 2015-12-24 | Discharge: 2015-12-24 | Disposition: A | Discharge status: Home / Self Care | Payer: BC Managed Care – PPO | Source: Ambulatory Visit | Attending: Family Medicine | Admitting: Family Medicine

## 2015-12-24 DIAGNOSIS — G8929 Other chronic pain: Secondary | ICD-10-CM

## 2015-12-24 DIAGNOSIS — M545 Low back pain, unspecified: Secondary | ICD-10-CM

## 2015-12-24 MED ORDER — IOPAMIDOL (ISOVUE-M 200) INJECTION 41%
1.0000 mL | Freq: Once | INTRAMUSCULAR | Status: AC
  Administered 2015-12-24: 1 mL via EPIDURAL

## 2015-12-24 MED ORDER — METHYLPREDNISOLONE ACETATE 40 MG/ML INJ SUSP (RADIOLOG
120.0000 mg | Freq: Once | INTRAMUSCULAR | Status: AC
  Administered 2015-12-24: 120 mg via EPIDURAL

## 2015-12-24 NOTE — Discharge Instructions (Signed)

## 2015-12-29 ENCOUNTER — Telehealth: Payer: Self-pay | Admitting: Radiology

## 2015-12-29 NOTE — Telephone Encounter (Signed)
Pt called to report to Dr. Mosetta PuttGrady that it had been 5 days and she has not notices any improvement post epidural steroid injection. Explained it could take longer. Pt wants her 2nd injection and explained she would need to call Dr. Herbert MoorsHudnal for the order.

## 2016-01-01 ENCOUNTER — Telehealth: Payer: Self-pay | Admitting: Family Medicine

## 2016-01-02 NOTE — Telephone Encounter (Signed)
Spoke to patient will set up injection.

## 2016-01-02 NOTE — Telephone Encounter (Signed)
If she got some relief from the shot I'd go ahead and schedule same injection.  If it did not help her much I'd refer her to neurosurgery for evaluation.  Thanks!

## 2016-01-07 ENCOUNTER — Other Ambulatory Visit: Payer: Self-pay | Admitting: Family Medicine

## 2016-01-07 DIAGNOSIS — G8929 Other chronic pain: Secondary | ICD-10-CM

## 2016-01-07 DIAGNOSIS — M545 Low back pain, unspecified: Secondary | ICD-10-CM

## 2016-01-09 ENCOUNTER — Other Ambulatory Visit: Payer: Self-pay | Admitting: General Practice

## 2016-01-09 MED ORDER — MELOXICAM 15 MG PO TABS: 15.0000 mg | ORAL_TABLET | Freq: Every day | ORAL | Status: DC

## 2016-01-09 NOTE — Telephone Encounter (Signed)
Please advise pt last OV with you 05/12/15 (right foot pain). Pt has been seeing Dr. Pearletha ForgeHudnall for low back pain and sciatica. meloxicam last filled 11/07/15 #30 with 0

## 2016-01-12 ENCOUNTER — Ambulatory Visit
Admission: RE | Admit: 2016-01-12 | Discharge: 2016-01-12 | Disposition: A | Discharge status: Home / Self Care | Payer: BC Managed Care – PPO | Source: Ambulatory Visit | Attending: Family Medicine | Admitting: Family Medicine

## 2016-01-12 ENCOUNTER — Other Ambulatory Visit: Payer: Self-pay | Admitting: Family Medicine

## 2016-01-12 DIAGNOSIS — G8929 Other chronic pain: Secondary | ICD-10-CM

## 2016-01-12 DIAGNOSIS — M545 Low back pain, unspecified: Secondary | ICD-10-CM

## 2016-01-12 MED ORDER — METHYLPREDNISOLONE ACETATE 40 MG/ML INJ SUSP (RADIOLOG
120.0000 mg | Freq: Once | INTRAMUSCULAR | Status: AC
  Administered 2016-01-12: 120 mg via EPIDURAL

## 2016-01-12 MED ORDER — IOPAMIDOL (ISOVUE-M 200) INJECTION 41%
1.0000 mL | Freq: Once | INTRAMUSCULAR | Status: AC
  Administered 2016-01-12: 1 mL via EPIDURAL

## 2016-01-12 NOTE — Discharge Instructions (Signed)

## 2016-01-22 ENCOUNTER — Telehealth: Payer: Self-pay | Admitting: Family Medicine

## 2016-01-22 NOTE — Telephone Encounter (Signed)
Spoke to patient, does not want to consult with neurosurgeon at this time. Stated she will continue with tylenol, meloxicam and topical medicines.

## 2016-01-22 NOTE — Telephone Encounter (Signed)
I think at this point she really needs to consult with neurosurgery for her back.  [name] not improving with conservative measures and is still in significant pain despite trying injection, exercises, physical therapy.  But as we've discussed previously there isn't much more she can do from a medication standpoint - tylenol 1000mg  three times a day, the meloxicam OR ibuprofen, topical medicines like capsaicin.

## 2016-01-29 ENCOUNTER — Other Ambulatory Visit: Payer: Self-pay | Admitting: Family Medicine

## 2016-01-30 ENCOUNTER — Other Ambulatory Visit: Payer: Self-pay | Admitting: *Deleted

## 2016-01-30 MED ORDER — CYCLOBENZAPRINE HCL 10 MG PO TABS: ORAL_TABLET | ORAL | Status: DC

## 2016-02-04 ENCOUNTER — Telehealth: Payer: Self-pay | Admitting: Family Medicine

## 2016-02-04 NOTE — Telephone Encounter (Signed)
Ok to go ahead with this, same as last shot earlier this month.  Thanks!

## 2016-02-05 ENCOUNTER — Other Ambulatory Visit: Payer: Self-pay | Admitting: Family Medicine

## 2016-02-05 DIAGNOSIS — M545 Low back pain, unspecified: Secondary | ICD-10-CM

## 2016-02-05 DIAGNOSIS — G8929 Other chronic pain: Secondary | ICD-10-CM

## 2016-02-08 ENCOUNTER — Other Ambulatory Visit: Payer: Self-pay | Admitting: Family Medicine

## 2016-02-09 ENCOUNTER — Telehealth: Payer: Self-pay | Admitting: Family Medicine

## 2016-02-09 NOTE — Telephone Encounter (Signed)
Pt states that she needs refill on meloxicam, [facility] on [address]

## 2016-02-09 NOTE — Telephone Encounter (Signed)
Spoke to patient and told her that she needed to contact her PCP about the meloxicam.

## 2016-02-10 NOTE — Telephone Encounter (Signed)
Last OV: 09/10/15 w/ Malva Cogan, PA-C, 05/12/15 w/ PCP Last filled: 01/09/16, #30, 0 RF Sig: Take 1 tablet (15 mg total) by mouth daily.

## 2016-02-11 ENCOUNTER — Encounter: Payer: Self-pay | Admitting: Family Medicine

## 2016-02-11 ENCOUNTER — Ambulatory Visit (INDEPENDENT_AMBULATORY_CARE_PROVIDER_SITE_OTHER): Payer: BC Managed Care – PPO | Admitting: Family Medicine

## 2016-02-11 ENCOUNTER — Encounter: Payer: BC Managed Care – PPO | Admitting: Family Medicine

## 2016-02-11 VITALS — BP 112/64 | HR 102 | Temp 98.4°F | Resp 16 | Ht 68.0 in | Wt 168.5 lb

## 2016-02-11 DIAGNOSIS — Z1239 Encounter for other screening for malignant neoplasm of breast: Secondary | ICD-10-CM | POA: Diagnosis not present

## 2016-02-11 DIAGNOSIS — Z0000 Encounter for general adult medical examination without abnormal findings: Secondary | ICD-10-CM | POA: Diagnosis not present

## 2016-02-11 MED ORDER — MELOXICAM 15 MG PO TABS: 15.0000 mg | ORAL_TABLET | Freq: Every day | ORAL | 1 refills | Status: DC

## 2016-02-11 NOTE — Progress Notes (Signed)
   Subjective:    Patient ID: Alison Anderson, female    DOB: 08-14-1955, 60 y.o.   MRN: [id number]  HPI CPE- no need for pap due to hysterectomy.  Due for colonoscopy and mammo.  UTD on Tdap.   Review of Systems Patient reports no vision/ hearing changes, adenopathy,fever, weight change,  persistant/recurrent hoarseness , swallowing issues, chest pain, palpitations, edema, persistant/recurrent cough, hemoptysis, dyspnea (rest/exertional/paroxysmal nocturnal), gastrointestinal bleeding (melena, rectal bleeding), abdominal pain, significant heartburn, GU symptoms (dysuria, hematuria, incontinence), Gyn symptoms (abnormal  bleeding, pain),  syncope, focal weakness, memory loss, numbness & tingling, skin/hair/nail changes, abnormal bruising or bleeding, anxiety, or depression.   + loose BMs for 'months and months'    Objective:   Physical Exam General Appearance:    Alert, cooperative, no distress, appears stated age  Head:    Normocephalic, without obvious abnormality, atraumatic  Eyes:    PERRL, conjunctiva/corneas clear, EOM's intact, fundi    benign, both eyes  Ears:    Normal TM's and external ear canals, both ears  Nose:   Nares normal, septum midline, mucosa normal, no drainage    or sinus tenderness  Throat:   Lips, mucosa, and tongue normal; teeth and gums normal  Neck:   Supple, symmetrical, trachea midline, no adenopathy;    Thyroid: no enlargement/tenderness/nodules  Back:     Symmetric, no curvature, ROM normal, no CVA tenderness  Lungs:     Clear to auscultation bilaterally, respirations unlabored  Chest Wall:    No tenderness or deformity   Heart:    Regular rate and rhythm, S1 and S2 normal, no murmur, rub   or gallop  Breast Exam:    Deferred to mammo  Abdomen:     Soft, non-tender, bowel sounds active all four quadrants,    no masses, no organomegaly  Genitalia:    Deferred  Rectal:    Extremities:   Extremities normal, atraumatic, no cyanosis or edema  Pulses:   2+ and  symmetric all extremities  Skin:   Skin color, texture, turgor normal, no rashes or lesions  Lymph nodes:   Cervical, supraclavicular, and axillary nodes normal  Neurologic:   CNII-XII intact, normal strength, sensation and reflexes    throughout          Assessment & Plan:

## 2016-02-11 NOTE — Telephone Encounter (Signed)
Medication filled to pharmacy as requested.   

## 2016-02-11 NOTE — Patient Instructions (Signed)
Follow up in 1 year or as needed We'll notify you of your lab results and make any changes if needed Continue to work on healthy diet and regular exercise- you can do it! Start 1 Immodium daily and see if that firms up and slows down your stools We'll call you with your mammogram appt Please consider colonoscopy or cologuard for colon cancer screening Call with any questions or concerns You and your family are in my thoughts and prayers!!!

## 2016-02-11 NOTE — Assessment & Plan Note (Signed)
Pt's PE WNL w/ exception of being overweight.  Due for mammo- order entered.  Also due for colon cancer screen- pt declines at this time.  UTD on immunizations.  Check labs.  Anticipatory guidance provided.

## 2016-02-11 NOTE — Telephone Encounter (Signed)
Ok for #30, 1 refill 

## 2016-02-11 NOTE — Progress Notes (Signed)
Pre visit review using our clinic review tool, if applicable. No additional management support is needed unless otherwise documented below in the visit note. 

## 2016-02-12 LAB — HEPATIC FUNCTION PANEL
ALT: 16 U/L (ref 0–35)
AST: 20 U/L (ref 0–37)
Albumin: 4.4 g/dL (ref 3.5–5.2)
Alkaline Phosphatase: 54 U/L (ref 39–117)
BILIRUBIN DIRECT: 0.1 mg/dL (ref 0.0–0.3)
TOTAL PROTEIN: 6.9 g/dL (ref 6.0–8.3)
Total Bilirubin: 0.4 mg/dL (ref 0.2–1.2)

## 2016-02-12 LAB — BASIC METABOLIC PANEL
BUN: 23 mg/dL (ref 6–23)
CALCIUM: 10.1 mg/dL (ref 8.4–10.5)
CO2: 27 meq/L (ref 19–32)
Chloride: 105 mEq/L (ref 96–112)
Creatinine, Ser: 0.92 mg/dL (ref 0.40–1.20)
GFR: 66.39 mL/min (ref >60.00)
GLUCOSE: 96 mg/dL (ref 70–99)
Potassium: 4 mEq/L (ref 3.5–5.1)
SODIUM: 142 meq/L (ref 135–145)

## 2016-02-12 LAB — CBC WITH DIFFERENTIAL/PLATELET
BASOS ABS: 0 10*3/uL (ref 0.0–0.1)
Basophils Relative: 0.2 % (ref 0.0–3.0)
EOS ABS: 0.2 10*3/uL (ref 0.0–0.7)
Eosinophils Relative: 1.5 % (ref 0.0–5.0)
HEMATOCRIT: 38.3 % (ref 36.0–46.0)
Hemoglobin: 12.5 g/dL (ref 12.0–15.0)
LYMPHS ABS: 2.8 10*3/uL (ref 0.7–4.0)
LYMPHS PCT: 21.7 % (ref 12.0–46.0)
MCHC: 32.5 g/dL (ref 30.0–36.0)
MCV: 92.3 fl (ref 78.0–100.0)
MONO ABS: 0.6 10*3/uL (ref 0.1–1.0)
Monocytes Relative: 4.5 % (ref 3.0–12.0)
NEUTROS ABS: 9.3 10*3/uL — AB (ref 1.4–7.7)
NEUTROS PCT: 72.1 % (ref 43.0–77.0)
PLATELETS: 263 10*3/uL (ref 150.0–400.0)
RBC: 4.15 Mil/uL (ref 3.87–5.11)
RDW: 14.9 % (ref 11.5–15.5)
WBC: 13 10*3/uL — ABNORMAL HIGH (ref 4.0–10.5)

## 2016-02-12 LAB — LIPID PANEL
CHOLESTEROL: 214 mg/dL — AB (ref 0–200)
HDL: 89.4 mg/dL (ref >39.00)
LDL Cholesterol: 100 mg/dL — ABNORMAL HIGH (ref 0–99)
NonHDL: 125.02
Total CHOL/HDL Ratio: 2
Triglycerides: 126 mg/dL (ref 0.0–149.0)
VLDL: 25.2 mg/dL (ref 0.0–40.0)

## 2016-02-12 LAB — VITAMIN D 25 HYDROXY (VIT D DEFICIENCY, FRACTURES): VITD: 28.46 ng/mL — AB (ref 30.00–100.00)

## 2016-02-12 LAB — TSH: TSH: 1.01 u[IU]/mL (ref 0.35–4.50)

## 2016-02-16 ENCOUNTER — Other Ambulatory Visit: Payer: Self-pay | Admitting: Family Medicine

## 2016-02-16 ENCOUNTER — Ambulatory Visit
Admission: RE | Admit: 2016-02-16 | Discharge: 2016-02-16 | Disposition: A | Discharge status: Home / Self Care | Payer: BC Managed Care – PPO | Source: Ambulatory Visit | Attending: Family Medicine | Admitting: Family Medicine

## 2016-02-16 ENCOUNTER — Telehealth: Payer: Self-pay | Admitting: Family Medicine

## 2016-02-16 DIAGNOSIS — G8929 Other chronic pain: Secondary | ICD-10-CM

## 2016-02-16 DIAGNOSIS — M545 Low back pain, unspecified: Secondary | ICD-10-CM

## 2016-02-16 MED ORDER — METHYLPREDNISOLONE ACETATE 40 MG/ML INJ SUSP (RADIOLOG
120.0000 mg | Freq: Once | INTRAMUSCULAR | Status: AC
  Administered 2016-02-16: 120 mg via EPIDURAL

## 2016-02-16 MED ORDER — IOPAMIDOL (ISOVUE-M 200) INJECTION 41%
1.0000 mL | Freq: Once | INTRAMUSCULAR | Status: AC
  Administered 2016-02-16: 1 mL via EPIDURAL

## 2016-02-16 NOTE — Telephone Encounter (Signed)
It may be better if she comes in.  We haven't seen her in a while anyway, I'd like to reexamine her.

## 2016-02-17 ENCOUNTER — Other Ambulatory Visit: Payer: Self-pay | Admitting: Family Medicine

## 2016-02-17 DIAGNOSIS — D72829 Elevated white blood cell count, unspecified: Secondary | ICD-10-CM

## 2016-02-17 NOTE — Telephone Encounter (Signed)
Spoke with patient yesterday afternoon, answered questions.  She had third ESI yesterday as well.

## 2016-02-19 ENCOUNTER — Other Ambulatory Visit (INDEPENDENT_AMBULATORY_CARE_PROVIDER_SITE_OTHER): Payer: BC Managed Care – PPO

## 2016-02-19 ENCOUNTER — Telehealth: Payer: Self-pay | Admitting: Family Medicine

## 2016-02-19 ENCOUNTER — Ambulatory Visit: Payer: BC Managed Care – PPO | Admitting: Family Medicine

## 2016-02-19 ENCOUNTER — Encounter: Payer: Self-pay | Admitting: General Practice

## 2016-02-19 ENCOUNTER — Other Ambulatory Visit: Payer: Self-pay | Admitting: *Deleted

## 2016-02-19 DIAGNOSIS — M25551 Pain in right hip: Secondary | ICD-10-CM

## 2016-02-19 DIAGNOSIS — D72829 Elevated white blood cell count, unspecified: Secondary | ICD-10-CM | POA: Diagnosis not present

## 2016-02-19 LAB — CBC WITH DIFFERENTIAL/PLATELET
BASOS ABS: 0 10*3/uL (ref 0.0–0.1)
Basophils Relative: 0.4 % (ref 0.0–3.0)
EOS ABS: 0.2 10*3/uL (ref 0.0–0.7)
Eosinophils Relative: 1.5 % (ref 0.0–5.0)
HEMATOCRIT: 36.7 % (ref 36.0–46.0)
Hemoglobin: 12.1 g/dL (ref 12.0–15.0)
LYMPHS PCT: 32.5 % (ref 12.0–46.0)
Lymphs Abs: 3.5 10*3/uL (ref 0.7–4.0)
MCHC: 32.9 g/dL (ref 30.0–36.0)
MCV: 92.1 fl (ref 78.0–100.0)
Monocytes Absolute: 0.6 10*3/uL (ref 0.1–1.0)
Monocytes Relative: 5.8 % (ref 3.0–12.0)
NEUTROS ABS: 6.4 10*3/uL (ref 1.4–7.7)
Neutrophils Relative %: 59.8 % (ref 43.0–77.0)
PLATELETS: 273 10*3/uL (ref 150.0–400.0)
RBC: 3.98 Mil/uL (ref 3.87–5.11)
RDW: 15.1 % (ref 11.5–15.5)
WBC: 10.7 10*3/uL — AB (ref 4.0–10.5)

## 2016-02-19 NOTE — Telephone Encounter (Signed)
Spoke to patient and gave her the information about PT

## 2016-02-19 NOTE — Telephone Encounter (Signed)
Patient's first two shots went fine, significantly better.  Last shot (monday) did not go well, leg felt weak, sore.  Today it is the same.  They told her to call you and see you today (I will make her appt).

## 2016-02-19 NOTE — Telephone Encounter (Signed)
Noted  

## 2016-02-19 NOTE — Telephone Encounter (Signed)
Ok for this? 

## 2016-02-23 ENCOUNTER — Encounter: Payer: Self-pay | Admitting: Family Medicine

## 2016-02-23 ENCOUNTER — Ambulatory Visit (INDEPENDENT_AMBULATORY_CARE_PROVIDER_SITE_OTHER): Payer: BC Managed Care – PPO | Admitting: Family Medicine

## 2016-02-23 DIAGNOSIS — M5441 Lumbago with sciatica, right side: Secondary | ICD-10-CM

## 2016-02-23 MED ORDER — NORTRIPTYLINE HCL 25 MG PO CAPS: 25.0000 mg | ORAL_CAPSULE | Freq: Every day | ORAL | 2 refills | Status: DC

## 2016-02-23 NOTE — Patient Instructions (Signed)
Go ahead with physical therapy about 2 times a week for 4 weeks and follow up with me after this. Start nortriptyline 25mg  at bedtime. Call me Friday or Monday to let me know how you're doing - hopefully we can increase this then. You can take tylenol, meloxicam, flexeril, and a topical medicine like aspercreme in addition to this if needed. Call me if you want to see the neurosurgeon before your follow up appointment in a month if things aren't progressing.

## 2016-02-24 ENCOUNTER — Ambulatory Visit: Payer: BC Managed Care – PPO | Attending: Family Medicine | Admitting: Physical Therapy

## 2016-02-24 DIAGNOSIS — M5441 Lumbago with sciatica, right side: Secondary | ICD-10-CM

## 2016-02-24 DIAGNOSIS — R262 Difficulty in walking, not elsewhere classified: Secondary | ICD-10-CM

## 2016-02-24 DIAGNOSIS — M25551 Pain in right hip: Secondary | ICD-10-CM | POA: Insufficient documentation

## 2016-02-24 NOTE — Telephone Encounter (Signed)
Referral sent for PT

## 2016-02-24 NOTE — Assessment & Plan Note (Signed)
with radiation into right leg.  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  S/p 3 injections, did 2 visits of PT - plans to do more extended course of PT.  Will also try nortriptyline in addition to her tylenol, meloxicam, flexeril.  Consider neurosurgery referral if still struggling.

## 2016-02-24 NOTE — Telephone Encounter (Signed)
Injection was set up and completed.

## 2016-02-24 NOTE — Progress Notes (Signed)
PCP and consultation requested by: Neena RhymesKatherine Tabori, MD  Subjective:   HPI: Patient is a 60 y.o. female here for low back pain into right leg.  05/21/15: Patient reports she's had about 2 weeks of worsening low back pain more in the buttock. Radiating down right leg posteriorly. No numbness or tingling. Comes and goes. Pain level up to 8/10 level. Has tried muscle relaxant, heat/ice. Also having left foot pain but this is much better - minimal pain currently. No skin changes, fever, other complaints.  07/15/15: Patient reports she continues to have fairly severe pain in low back going into right leg. No numbness or tingling. Pain level 6/10. Doing home exercises. Took prednisone - initial benefit then pain recurred. Interested in seeing chiropractor. No skin changes, fever, other complaints.  12/04/15: Patient reports she's really been struggling with pain in low back and into right leg. Pain level 10/10, sharp. Had pyelonephritis about 1 month ago also, ? Strained her back on right side around this time too. No bowel/bladder dysfunction. Constant pain regardless of if shes standing or walking. Tried some of her husband's prednisone in addition to courses she has had in the past. No numbness or tingling.  8/14: Patient returns with continued pain in low back radiating into right leg. Pain level 7/10, sharp. Feels more wobbly after 3rd ESI she had, feels like may have regressed 20-30%. Not better with any particular positioning. Taking flexeril, mobic, tylenol. No numbness, tingling.  Past Medical History:  Diagnosis Date  . Menopause    at [age]    Current Outpatient Prescriptions on File Prior to Visit  Medication Sig Dispense Refill  . cyclobenzaprine (FLEXERIL) 10 MG tablet Take one tablet by mouth every 8 hours as needed for spasms 60 tablet 1  . meloxicam (MOBIC) 15 MG tablet Take 1 tablet (15 mg total) by mouth daily. 30 tablet 1  . Multiple Vitamins-Minerals  (MULTIVITAMINS) CHEW Chew 1 each by mouth daily.     No current facility-administered medications on file prior to visit.     Past Surgical History:  Procedure Laterality Date  . ABDOMINAL HYSTERECTOMY  2012  . CHOLECYSTECTOMY      Allergies  Allergen Reactions  . Tramadol Itching and Rash    Social History   Social History  . Marital status: Married    Spouse name: N/A  . Number of children: N/A  . Years of education: N/A   Occupational History  . Not on file.   Social History Main Topics  . Smoking status: Never Smoker  . Smokeless tobacco: Never Used  . Alcohol use 0.0 oz/week     Comment: rarely  . Drug use: Unknown  . Sexual activity: Not on file   Other Topics Concern  . 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 Diabetes Mother   . Heart disease Mother   . Hypertension Mother   . Diabetes Father     borderline  . Cancer Maternal Grandmother     breast  . Stroke      grandparent  . Breast cancer      grandmother    BP 129/81   Pulse (!) 109   Ht 5\' 8"  (1.727 m)   Wt 165 lb (74.8 kg)   BMI 25.09 kg/m   Review of Systems: See HPI above.  Objective:  Physical Exam:  Gen: NAD  Back: No gross deformity, scoliosis. TTP right lumbar paraspinal region.  No midline or bony TTP. FROM. Strength LEs 5/5 all muscle groups.   2+ MSRs in patellar and achilles tendons, equal bilaterally. Negative SLRs. Sensation intact to light touch bilaterally.  Right hip: FROM Negative logroll  Assessment & Plan:  1. Low back pain - with radiation into right leg.  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  S/p 3 injections, did 2 visits of PT - plans to do more extended course of PT.  Will also try nortriptyline in addition to her tylenol, meloxicam, flexeril.  Consider neurosurgery referral if still  struggling.

## 2016-02-25 NOTE — Therapy (Signed)
[facility] [address]  [address] [place], [place], [address] Phone: [phone]   Fax:  [phone]  Physical Therapy Evaluation  Patient Details  Name: Alison Anderson MRN: [id number] Date of Birth: 12/25/1955 Referring Provider: Norton Blizzard, MD  Encounter Date: 02/24/2016      PT End of Session - 02/24/16 1615    Visit Number 1   Number of Visits 16   Date for PT Re-Evaluation 04/23/16   PT Start Time 1530   PT Stop Time 1616   PT Time Calculation (min) 46 min   Activity Tolerance Patient tolerated treatment well   Behavior During Therapy [facility] for tasks assessed/performed      Past Medical History:  Diagnosis Date  . Menopause    at 59    Past Surgical History:  Procedure Laterality Date  . ABDOMINAL HYSTERECTOMY  2012  . CHOLECYSTECTOMY      There were no vitals filed for this visit.       Subjective Assessment - 02/24/16 1534    Subjective Pt reports h/o low back and R hip/LE pain almost a year ago. Has had mutiple injections, the first few helped but the last one has made pain worse. Tried PT briefly (2 visits) in April but did not follow through as she felt like she was getting better.   Limitations Standing;Walking   Diagnostic tests 12/13/15 - Lumbar MRI:  1. Rightward disc protrusion and bilateral facet hypertrophy at L4-5 results in moderate to severe right and moderate left subarticular stenosis. The foramina are patent.  2. Broad-based disc protrusion and central annular tear at L3-4 without significant stenosis.  3. Shallow central disc protrusion at L5-S1 without significant stenosis.   Patient Stated Goals "to be able to walk & work painfree"   Currently in Pain? Yes   Pain Score 6   Least 3/10, Avg 7-8/10, Worst 9/10   Pain Location Hip   Pain Orientation Right;Posterior   Pain Descriptors / Indicators Dull;Sharp   Pain Type Acute pain;Chronic pain   Pain Radiating Towards pain down back of  leg to calf   Pain Onset More than a month ago   Pain Frequency Intermittent   Aggravating Factors  Walking, movements while showering   Pain Relieving Factors Tylenol, heat, estim   Effect of Pain on Daily Activities Limits walking tolerance, has to sit at times while showering            [facility] PT Assessment - 02/24/16 1530      Assessment   Medical Diagnosis R low back & hip/LE pain   Referring Provider Norton Blizzard, MD   Onset Date/Surgical Date --  Sept 2016   Next MD Visit TBD   Prior Therapy 2 visits in April 2017     Balance Screen   Has the patient fallen in the past 6 months No   Has the patient had a decrease in activity level because of a fear of falling?  No   Is the patient reluctant to leave their home because of a fear of falling?  No     Home Environment   Living Environment Private residence   Type of Home House   Home Access Stairs to enter   Entrance Stairs-Number of Steps 4   Entrance Stairs-Rails Right   Home Layout One level     Prior Function   Level of Independence Independent   Vocation Full time employment   Vocation Requirements  Geologist, engineeringTeacher assistant - kindergarten   Leisure mostly sendentary     Observation/Other Assessments   Focus on Therapeutic Outcomes (FOTO)  Hip - 40% (60% limitation); predicted 53% (47% limitation)     Posture/Postural Control   Posture/Postural Control Postural limitations   Postural Limitations Decreased lumbar lordosis;Flexed trunk   Posture Comments levoscoliosis     ROM / Strength   AROM / PROM / Strength AROM;Strength     AROM   Overall AROM Comments pain with lumbar extension, side-bending R>L, and B rotation   AROM Assessment Site Lumbar   Lumbar Flexion WFL   Lumbar Extension 25%   Lumbar - Right Side Bend hand to lat knee   Lumbar - Left Side Bend hand to lat knee   Lumbar - Right Rotation 75%   Lumbar - Left Rotation 75%     Strength   Strength Assessment Site Hip;Knee   Right/Left Hip  Right;Left   Right Hip Flexion 4/5  pain   Right Hip Extension 3-/5   Right Hip External Rotation  4-/5  pain   Right Hip Internal Rotation 4-/5  pain   Right Hip ABduction 4/5   Right Hip ADduction 3+/5   Left Hip Flexion 4-/5  pain   Left Hip Extension 3-/5   Left Hip External Rotation 4-/5  pain on R   Left Hip Internal Rotation 4-/5   Left Hip ABduction 4-/5   Left Hip ADduction 4-/5   Right/Left Knee Right;Left   Right Knee Flexion 4-/5  pain   Right Knee Extension 4/5   Left Knee Flexion 4/5   Left Knee Extension 4/5     Flexibility   Soft Tissue Assessment /Muscle Length yes   Hamstrings mildly tight B   Quadriceps mod/severely tight B hip flexors and quads   ITB WFL   Piriformis mild/mod tight B   Quadratus Lumborum tight on R     Palpation   Palpation comment ttp over R SIJ and piriformis     Special Tests    Special Tests Lumbar;Hip Special Tests   Lumbar Tests FABER test;Straight Leg Raise   Hip Special Tests  Ober's Test     FABER test   findings Positive   Side Right     Straight Leg Raise   Findings Negative     Ober's Test   Findings Positive   Side Left                PT Long Term Goals - 02/24/16 1616      PT LONG TERM GOAL #1   Title Pt will understand proper posture and body mechanics by 04/23/16   Status New     PT LONG TERM GOAL #2   Title Increase lumbar extension ROM 25% by 04/23/16   Status New     PT LONG TERM GOAL #3   Title Decrease low back/R hip pain 50% by 04/23/16   Status New     PT LONG TERM GOAL #4   Title Increase B LE strength to >/= 4/5 by 04/23/16   Status New     PT LONG TERM GOAL #5   Title Pt will report ability to walk and complete basic household chores and job tasks without limitation due to low back or R hip/LE pain by 04/23/16   Status New               Plan - 02/24/16 1616    Clinical Impression Statement Alison Anderson is a  60 y/o female with c/o low back and R buttock pain with  radicular pain down posterior R LE to posterior calf originating ~1 year ago with fluctuating presentation over past year. Pt denies radicular numbness or tingling except briefly after last injection. Pt states current pain is primarily in R low back and extends into R buttock at 6/10 at time of eval; with least pain 3/10, average pain at 7-8/10 and worst pain up to 9/10. Pt's chief complaint of pain is with walking (causes her to walk stooped over) and movements necessary for showering (requires her to sit on edge of tub at times while showering). Assessment revealed restrictions in lumbar ROM most notable in extension and B rotation, with pain reported in R low back/hip with extension, B rotation and B side-bending (R>L). Postural assessment reveals marked levoscoliosis. Flexibility assessment reveals significant proximal LE tightness mosted pronounced in B quads and hip flexors (severely limiting hip extension ROM), B piriformis, B HS and R QL. Pt ttp over R SIJ and piriformis. Proximal LE strength assessment reveals mod weakness in B hips and knees, R > L (refer to above MMT). Given muscle imbalance and tightness, radicular lumbar stenosis symptoms may also be exacerbated by piriformis syndrome. POC will focus on improving proximal LE soft tissue pliability for improved lumbar ROM/flexibility, core/proximal stability training, postural training with emphasis on neutral spine alignment, LE strengthening and manual therapy/modalities PRN for pain. May consider mechanical traction if radicular symptoms persist and/or dry needling for pain/increased muscle tension.   Rehab Potential Good   PT Frequency 2x / week   PT Duration 8 weeks   PT Treatment/Interventions Patient/family education;Therapeutic exercise;Neuromuscular re-education;Manual techniques;Taping;Dry needling;Electrical Stimulation;Moist Heat;Iontophoresis 4mg /ml Dexamethasone;Ultrasound;Traction;Functional mobility training;Therapeutic  activities;ADLs/Self Care Home Management   PT Next Visit Plan Pt to bring in prior HEP's for review/update as indicated   Consulted and Agree with Plan of Care Patient      Patient will benefit from skilled therapeutic intervention in order to improve the following deficits and impairments:  Pain, Impaired flexibility, Decreased range of motion, Decreased strength, Increased muscle spasms, Difficulty walking, Abnormal gait, Decreased activity tolerance  Visit Diagnosis: Right-sided low back pain with right-sided sciatica  Pain in right hip  Difficulty in walking, not elsewhere classified     Problem List Patient Active Problem List   Diagnosis Date Noted  . Low back pain with right-sided sciatica 12/09/2015  . UTI symptoms 09/10/2015  . RLQ abdominal pain 09/10/2015  . Calf pain 09/10/2015  . Right hip pain 05/22/2015  . Left foot pain 05/12/2015  . Acute bacterial bronchitis 03/12/2015  . Physical exam 06/05/2014  . Lumbar paraspinal muscle spasm 06/15/2013  . Leg pain 02/23/2011  . HAND PAIN, RIGHT 08/22/2009  . OTHER DISEASES OF NASAL CAVITY AND SINUSES 08/07/2008    Marry GuanJoAnne M Aryn Kops, PT, MPT 02/25/2016, 10:37 AM  [facility] [place] [facility] [address]  [address] [place], [place], [address] Phone: [phone]   Fax:  [phone]  Name: Alison Anderson MRN: [id number] Date of Birth: 1955/08/10

## 2016-03-01 ENCOUNTER — Telehealth: Payer: Self-pay | Admitting: Family Medicine

## 2016-03-01 ENCOUNTER — Ambulatory Visit: Payer: BC Managed Care – PPO

## 2016-03-01 NOTE — Telephone Encounter (Signed)
I would tell her to go ahead and take 2 tablets of what we gave her at bedtime (25mg ).  If she wants to go to the max dose, have her call us next Monday and we will call in the max dose of 75mg  at bedtime.    I think this will be easier and one less script than her having to pick up 50mg  tablets, take them for only 1 week, then have to get 75mg  tablets.

## 2016-03-01 NOTE — Telephone Encounter (Signed)
Spoke to the patient and gave her information provided by the physician. Patient will call next Monday to let us know how she is doing.

## 2016-03-02 ENCOUNTER — Ambulatory Visit: Payer: BC Managed Care – PPO | Admitting: Physical Therapy

## 2016-03-02 DIAGNOSIS — R262 Difficulty in walking, not elsewhere classified: Secondary | ICD-10-CM

## 2016-03-02 DIAGNOSIS — M25551 Pain in right hip: Secondary | ICD-10-CM

## 2016-03-02 DIAGNOSIS — M5441 Lumbago with sciatica, right side: Secondary | ICD-10-CM

## 2016-03-02 NOTE — Therapy (Signed)
[facility] [address]  [address] [place], [place], [address] Phone: [phone]   Fax:  [phone]  Physical Therapy Treatment  Patient Details  Name: Alison MontgomeryKaren F Anderson MRN: [id number] Date of Birth: Mar 06, 1956 Referring Provider: Norton BlizzardShane Hudnall, MD  Encounter Date: 03/02/2016      PT End of Session - 03/02/16 1530    Visit Number 2   Number of Visits 16   Date for PT Re-Evaluation 04/23/16   PT Start Time 1530   PT Stop Time 1621   PT Time Calculation (min) 51 min   Activity Tolerance Patient tolerated treatment well;Patient limited by pain   Behavior During Therapy [facility] for tasks assessed/performed;Impulsive      Past Medical History:  Diagnosis Date  . Menopause    at [age]    Past Surgical History:  Procedure Laterality Date  . ABDOMINAL HYSTERECTOMY  2012  . CHOLECYSTECTOMY      There were no vitals filed for this visit.      Subjective Assessment - 03/02/16 1530    Subjective Pt noting she started working on her old HEP stretches and feels that pain has worsened with this. Pt reports she has taken 9 Tylenol & 2 muscle relaxants so far today.   Patient Stated Goals "to be able to walk & work painfree"   Currently in Pain? Yes   Pain Score 9    Pain Location Hip   Pain Orientation Right;Posterior   Pain Onset --          Today's Treatment  TherEx (HEP instruction) B SKTC x30" B Figure 4 Glute stretch x30" B KTOS piriformis stretch (hooklying & seated) x30" each  B Mod thomas Hip Flexor stretch x30" Abdominal bracing 10x5" TrA + Alt Hip ABD/ER with green TB 10x3" TrA + Hooklying march with green TB 10x3" Bridge + B Hip ABD isometric with green TB 10x3"  (limited hip clearance) TrA + Hooklying Hip ADD ball squeeze 10x3" Standing calf stretches x30" (2 versions demonstrated)            PT Education - 03/02/16 1615    Education provided Yes   Education Details Review of prior HEPs -  Condensed & updated; Repeated instruction for proper pacing & technique   Person(s) Educated Patient   Methods Explanation;Demonstration;Handout;Verbal cues;Tactile cues   Comprehension Verbalized understanding;Returned demonstration;Verbal cues required;Tactile cues required;Need further instruction             PT Long Term Goals - 03/02/16 1621      PT LONG TERM GOAL #1   Title Pt will understand proper posture and body mechanics by 04/23/16   Status On-going     PT LONG TERM GOAL #2   Title Increase lumbar extension ROM 25% by 04/23/16   Status On-going     PT LONG TERM GOAL #3   Title Decrease low back/R hip pain 50% by 04/23/16   Status On-going     PT LONG TERM GOAL #4   Title Increase B LE strength to >/= 4/5 by 04/23/16   Status On-going     PT LONG TERM GOAL #5   Title Pt will report ability to walk and complete basic household chores and job tasks without limitation due to low back or R hip/LE pain by 04/23/16   Status On-going               Plan - 03/02/16 1745    Clinical Impression Statement Pt  reporting increased LBP & R buttock pain after attempting to perform old HEP stretches and exercises, but after reviewing these with the pt, feel that pt's technique was cause of increased pain as pt moving very rapidly through stretches, bouncing during stretches and forcing stretches into painful range. Entire session focused on review/update of HEP with emphasis on proper technique, esp avoiding increased pain during activities. Pt will require further review/training to ensure proper technique.   PT Treatment/Interventions Patient/family education;Therapeutic exercise;Neuromuscular re-education;Manual techniques;Taping;Dry needling;Electrical Stimulation;Moist Heat;Iontophoresis 4mg /ml Dexamethasone;Ultrasound;Traction;Functional mobility training;Therapeutic activities;ADLs/Self Care Home Management   PT Next Visit Plan Review initial HEP; Lumbar/proximal LE  flexibility & strengthening; Postural education; Manual therapy & modalities PRN for pain   Consulted and Agree with Plan of Care Patient      Patient will benefit from skilled therapeutic intervention in order to improve the following deficits and impairments:  Pain, Impaired flexibility, Decreased range of motion, Decreased strength, Increased muscle spasms, Difficulty walking, Abnormal gait, Decreased activity tolerance  Visit Diagnosis: Right-sided low back pain with right-sided sciatica  Pain in right hip  Difficulty in walking, not elsewhere classified     Problem List Patient Active Problem List   Diagnosis Date Noted  . Low back pain with right-sided sciatica 12/09/2015  . UTI symptoms 09/10/2015  . RLQ abdominal pain 09/10/2015  . Calf pain 09/10/2015  . Right hip pain 05/22/2015  . Left foot pain 05/12/2015  . Acute bacterial bronchitis 03/12/2015  . Physical exam 06/05/2014  . Lumbar paraspinal muscle spasm 06/15/2013  . Leg pain 02/23/2011  . HAND PAIN, RIGHT 08/22/2009  . OTHER DISEASES OF NASAL CAVITY AND SINUSES 08/07/2008    Marry GuanJoAnne M Lailynn Southgate, PT, MPT 03/02/2016, 6:27 PM  [facility] [place] [address]  [address] [place], [place], [address] Phone: [phone]   Fax:  [phone]  Name: Alison MontgomeryKaren F Anderson MRN: [id number] Date of Birth: 1955-12-29

## 2016-03-03 ENCOUNTER — Ambulatory Visit: Payer: BC Managed Care – PPO

## 2016-03-03 ENCOUNTER — Telehealth: Payer: Self-pay | Admitting: Family Medicine

## 2016-03-03 DIAGNOSIS — R262 Difficulty in walking, not elsewhere classified: Secondary | ICD-10-CM

## 2016-03-03 DIAGNOSIS — M5441 Lumbago with sciatica, right side: Secondary | ICD-10-CM

## 2016-03-03 DIAGNOSIS — M25551 Pain in right hip: Secondary | ICD-10-CM

## 2016-03-03 NOTE — Telephone Encounter (Signed)
Spoke to patient and she does not want to pursue the nerve block injection. She wants to know if she could do another ESI due to the 3rd ESI injection which did not provide relief.

## 2016-03-03 NOTE — Telephone Encounter (Signed)
Spoke to patient and told her that 3 injections is all she can do within a 6 month period.

## 2016-03-03 NOTE — Telephone Encounter (Signed)
No, 3 is the most you can get within a 6 month period.

## 2016-03-03 NOTE — Telephone Encounter (Signed)
Gunnar Fusiaula - can you get more information from the patient on this?  I'm not sure what she is talking about.

## 2016-03-03 NOTE — Therapy (Addendum)
[facility] [address]  [place], [place], [address] Phone: [phone]   Fax:  [phone]  Physical Therapy Treatment  Patient Details  Name: Alison Anderson MRN: [id number] Date of Birth: 1956-07-12 Referring Provider: Karlton Lemon, MD  Encounter Date: 03/03/2016      PT End of Session - 03/03/16 1758    Visit Number 3   Number of Visits 16   Date for PT Re-Evaluation 04/23/16   PT Start Time 1702   PT Stop Time 1748   PT Time Calculation (min) 46 min   Activity Tolerance Patient tolerated treatment well;Patient limited by pain   Behavior During Therapy [facility] for tasks assessed/performed;Impulsive      Past Medical History:  Diagnosis Date  . Menopause    at 30    Past Surgical History:  Procedure Laterality Date  . ABDOMINAL HYSTERECTOMY  2012  . CHOLECYSTECTOMY      There were no vitals filed for this visit.      Subjective Assessment - 03/03/16 1712    Subjective Pt. reports she feels sore following yesterday's treatment.   Patient Stated Goals "to be able to walk & work painfree"   Currently in Pain? Yes   Pain Score 7    Pain Location Back   Pain Orientation Right;Posterior   Pain Descriptors / Indicators Dull;Sharp   Pain Type Chronic pain   Pain Radiating Towards pain down back of leg to calf   Pain Onset More than a month ago   Pain Frequency Intermittent   Multiple Pain Sites No       Today's treatment:  Therex: NuStep: level 4, 5 min  B HS, piriformis, glute, SKTC stretch x 30 sec each   HEP review:  SKTC stretch x 30 sec each  B Modified 2 piriformis stretch x 30 sec each side  B Modified 3 piriformis stretch x 30 sec each side  Seated B piriformis stretch x 30 sec  Mod thomas stretch x 30 sec each  Hooklying abdominal bracing 5" x 10 reps  Hooklying alternating hip abd/ER with green TB x 10 reps each side  Hooklying adduction ball squeeze 5" x 10 reps Standing calf  stretch x 30 sec each leg  Standing modified calf stretch x 30 sec each    * poor technique with nearly all HEP activities today; heavy VC's required throughout for proper technique           PT Long Term Goals - 03/02/16 1621      PT LONG TERM GOAL #1   Title Pt will understand proper posture and body mechanics by 04/23/16   Status On-going     PT LONG TERM GOAL #2   Title Increase lumbar extension ROM 25% by 04/23/16   Status On-going     PT LONG TERM GOAL #3   Title Decrease low back/R hip pain 50% by 04/23/16   Status On-going     PT LONG TERM GOAL #4   Title Increase B LE strength to >/= 4/5 by 04/23/16   Status On-going     PT LONG TERM GOAL #5   Title Pt will report ability to walk and complete basic household chores and job tasks without limitation due to low back or R hip/LE pain by 04/23/16   Status On-going               Plan - 03/03/16 1758    Clinical Impression Statement  Pt. reports she feels sore following yesterday's treatment with an initial LBP/R hip pain of 7/10.  Pt. muscular guarding and movement pattern throughout therex was not consistent with high pain ratings.  Today's treatment focused on HEP review.  Pt. demo'd poor technique with nearly all HEP activities despite constant instruction.  Pt. seemed nervous throughout HEP review which seemed to limit recall technique and proper performance of activities.  Pt. may require further instruction with HEP for proper performance over course of POC.     PT Treatment/Interventions Patient/family education;Therapeutic exercise;Neuromuscular re-education;Manual techniques;Taping;Dry needling;Electrical Stimulation;Moist Heat;Iontophoresis 27m/ml Dexamethasone;Ultrasound;Traction;Functional mobility training;Therapeutic activities;ADLs/Self Care Home Management   PT Next Visit Plan Review initial HEP; Lumbar/proximal LE flexibility & strengthening; Postural education; Manual therapy & modalities PRN for pain       Patient will benefit from skilled therapeutic intervention in order to improve the following deficits and impairments:  Pain, Impaired flexibility, Decreased range of motion, Decreased strength, Increased muscle spasms, Difficulty walking, Abnormal gait, Decreased activity tolerance  Visit Diagnosis: Right-sided low back pain with right-sided sciatica  Pain in right hip  Difficulty in walking, not elsewhere classified     Problem List Patient Active Problem List   Diagnosis Date Noted  . Low back pain with right-sided sciatica 12/09/2015  . UTI symptoms 09/10/2015  . RLQ abdominal pain 09/10/2015  . Calf pain 09/10/2015  . Right hip pain 05/22/2015  . Left foot pain 05/12/2015  . Acute bacterial bronchitis 03/12/2015  . Physical exam 06/05/2014  . Lumbar paraspinal muscle spasm 06/15/2013  . Leg pain 02/23/2011  . HAND PAIN, RIGHT 08/22/2009  . OTHER DISEASES OF NASAL CAVITY AND SINUSES 08/07/2008    MBess Harvest PTA 03/03/2016, 6:09 PM  [facility] [place] [address]Phone: [phone]  Fax:  [phone] Name: Alison ISSAMRN: [id number] of Birth: [birth year]  PHYSICAL THERAPY DISCHARGE SUMMARY  Visits from Start of Care: 3  Current functional level related to goals / functional outcomes:   Unable to assess secondary to pt called to cancel all further appts and was being referred for neurosurgery.   Remaining deficits:   Unable to assess secondary to failure to return to PT.   Education / Equipment:   HEP - Pt continuing to require constant supervision as of last treatment visit.  Plan: Patient agrees to discharge.  Patient goals were not met. Patient is being discharged due to the patient's request.  ?????    JPercival Spanish PT, MPT 03/19/16, 9:59 AM  [facility] [place] [address]Phone: [phone]  Fax:  [phone]

## 2016-03-04 ENCOUNTER — Encounter: Payer: BC Managed Care – PPO | Admitting: Physical Therapy

## 2016-03-08 ENCOUNTER — Telehealth: Payer: Self-pay | Admitting: Family Medicine

## 2016-03-08 MED ORDER — NORTRIPTYLINE HCL 75 MG PO CAPS: 75.0000 mg | ORAL_CAPSULE | Freq: Every day | ORAL | 2 refills | Status: DC

## 2016-03-08 NOTE — Telephone Encounter (Signed)
Spoke to patient and she is going to bring the paperwork by in the morning. She is still having trouble with walking for long periods of time at work.

## 2016-03-08 NOTE — Telephone Encounter (Signed)
Ok I have sent in nortriptyline 75mg  capsules - please let her know.  Also, I would need to know what the FMLA paperwork is for - we don't have her on any restrictions.  Does she need to be on some?

## 2016-03-09 ENCOUNTER — Telehealth: Payer: Self-pay | Admitting: Family Medicine

## 2016-03-09 ENCOUNTER — Ambulatory Visit: Payer: BC Managed Care – PPO

## 2016-03-09 NOTE — Telephone Encounter (Signed)
Will send referral to neurosurgeon.

## 2016-03-09 NOTE — Telephone Encounter (Signed)
Right, I saw that below.  So does she need restrictions then?  Restricting how often she's walking?  Limitation on work hours in a day?  15 minute break every hour?  A letter in addition to [facility]?

## 2016-03-09 NOTE — Telephone Encounter (Signed)
Ok to go ahead with referral - we discussed this at her last office visit if she wasn't improving.  Thanks!

## 2016-03-09 NOTE — Telephone Encounter (Signed)
Spoke to patient when she brought papers in today. I asked her what kind of restrictions needed. Restrictions for how often she walks, does she need a 15 min break every hour. She stated she had written down her job description. Gave papers to physician.

## 2016-03-09 NOTE — Telephone Encounter (Signed)
She wrote a letter with her paperwork - will call her if any other confusion.

## 2016-03-10 ENCOUNTER — Other Ambulatory Visit: Payer: Self-pay | Admitting: Family Medicine

## 2016-03-11 ENCOUNTER — Other Ambulatory Visit: Payer: Self-pay | Admitting: *Deleted

## 2016-03-11 MED ORDER — CYCLOBENZAPRINE HCL 10 MG PO TABS: ORAL_TABLET | ORAL | 1 refills | Status: DC

## 2016-03-16 ENCOUNTER — Telehealth: Payer: Self-pay | Admitting: Family Medicine

## 2016-03-16 NOTE — Telephone Encounter (Signed)
It was not filled out yet because there was confusion what the paperwork was for.  There was discussion of hour restrictions, breaks hourly, but she also noted she was taking a 30 day leave.  Gunnar Fusiaula was able to clarify with patient today that she is taking the leave on her own - I will fill paperwork out for the restrictions.

## 2016-03-18 ENCOUNTER — Encounter: Payer: BC Managed Care – PPO | Admitting: Physical Therapy

## 2016-03-18 NOTE — Telephone Encounter (Signed)
Paperwork completed and taken to front desk.

## 2016-03-29 ENCOUNTER — Telehealth: Payer: Self-pay | Admitting: Family Medicine

## 2016-03-29 ENCOUNTER — Encounter: Payer: BC Managed Care – PPO | Admitting: Physical Therapy

## 2016-03-29 NOTE — Telephone Encounter (Signed)
Letter printed.

## 2016-03-29 NOTE — Telephone Encounter (Signed)
I think it's reasonable for her to be out through the appointment with neurosurgeon.  If you can let me know what date this is, we can write a note to extend this.  If they decide to pursue surgical intervention I would ask her to ask them about further out of work time.  She does not need to come back in to see us - we have exhausted conservative measures for her back.

## 2016-03-29 NOTE — Telephone Encounter (Signed)
She needs more documentation/letter to be out of work longer than Sep. 18th and approximately how long she will be out.  She has an appointment with neurosurgeon next week.  She would like to explain what she needs in more detail with you over the phone.  Alison PattenDonna Anderson is her case worker and she can give you her address for letter.  Also, should she come in to be seen again regarding this?  Thanks  (this was a voicemail she left)

## 2016-04-01 ENCOUNTER — Telehealth: Payer: Self-pay | Admitting: Family Medicine

## 2016-04-01 ENCOUNTER — Encounter: Payer: BC Managed Care – PPO | Admitting: Physical Therapy

## 2016-04-01 NOTE — Telephone Encounter (Signed)
Ok,noted

## 2016-04-07 ENCOUNTER — Other Ambulatory Visit: Payer: Self-pay | Admitting: Family Medicine

## 2016-04-07 NOTE — Telephone Encounter (Signed)
Last OV 02/11/16 meloxicam last filled 02/11/16 #30 with 1

## 2016-04-07 NOTE — Telephone Encounter (Signed)
Medication filled to pharmacy as requested.   

## 2016-04-08 ENCOUNTER — Encounter: Payer: BC Managed Care – PPO | Admitting: Physical Therapy

## 2016-04-12 ENCOUNTER — Ambulatory Visit
Admission: RE | Admit: 2016-04-12 | Discharge: 2016-04-12 | Disposition: A | Discharge status: Home / Self Care | Payer: BC Managed Care – PPO | Source: Ambulatory Visit | Attending: Family Medicine | Admitting: Family Medicine

## 2016-04-12 DIAGNOSIS — Z1239 Encounter for other screening for malignant neoplasm of breast: Secondary | ICD-10-CM

## 2016-04-22 ENCOUNTER — Other Ambulatory Visit: Payer: Self-pay | Admitting: Family Medicine

## 2016-04-26 ENCOUNTER — Encounter: Payer: BC Managed Care – PPO | Admitting: Family Medicine

## 2016-05-12 HISTORY — PX: OTHER SURGICAL HISTORY: SHX169

## 2016-06-12 ENCOUNTER — Other Ambulatory Visit: Payer: Self-pay | Admitting: Family Medicine

## 2016-06-17 ENCOUNTER — Telehealth: Payer: Self-pay | Admitting: Family Medicine

## 2016-06-17 NOTE — Telephone Encounter (Signed)
Pt states that she has a uti and asking for something to be called into [facility] [address]

## 2016-06-17 NOTE — Telephone Encounter (Signed)
Unfortunately pt would need either an appt or she would need a lab appt to drop off a urine sample.

## 2016-06-17 NOTE — Telephone Encounter (Signed)
Called pt and LMOVM to return call.  °

## 2016-06-17 NOTE — Telephone Encounter (Signed)
Patient scheduled for lab appt, 06/18/16 @ 4pm.

## 2016-06-18 ENCOUNTER — Other Ambulatory Visit: Payer: BC Managed Care – PPO

## 2016-06-22 ENCOUNTER — Other Ambulatory Visit: Payer: Self-pay | Admitting: Family Medicine

## 2016-06-22 ENCOUNTER — Telehealth: Payer: Self-pay | Admitting: Family Medicine

## 2016-06-22 ENCOUNTER — Other Ambulatory Visit: Payer: BC Managed Care – PPO

## 2016-06-22 DIAGNOSIS — R300 Dysuria: Secondary | ICD-10-CM

## 2016-06-22 NOTE — Telephone Encounter (Signed)
Pt needs an [facility] placed to [facility] location for [facility] sample to check for uti.

## 2016-06-22 NOTE — Telephone Encounter (Signed)
Labs ordered.

## 2016-06-24 ENCOUNTER — Other Ambulatory Visit: Payer: Self-pay | Admitting: General Practice

## 2016-06-24 LAB — URINE CULTURE

## 2016-06-24 MED ORDER — CEPHALEXIN 500 MG PO CAPS: 500.0000 mg | ORAL_CAPSULE | Freq: Two times a day (BID) | ORAL | 0 refills | Status: DC

## 2016-08-09 ENCOUNTER — Other Ambulatory Visit: Payer: Self-pay | Admitting: Family Medicine

## 2016-10-03 ENCOUNTER — Other Ambulatory Visit: Payer: Self-pay | Admitting: Family Medicine

## 2016-10-06 ENCOUNTER — Telehealth: Payer: Self-pay | Admitting: Family Medicine

## 2016-10-06 ENCOUNTER — Encounter: Payer: Self-pay | Admitting: Gastroenterology

## 2016-10-06 DIAGNOSIS — Z1211 Encounter for screening for malignant neoplasm of colon: Secondary | ICD-10-CM

## 2016-10-06 NOTE — Telephone Encounter (Deleted)
Pt asking for a

## 2016-10-06 NOTE — Telephone Encounter (Signed)
Please advise 

## 2016-10-06 NOTE — Addendum Note (Signed)
Addended by: Eulah PontALBRIGHT, Alaster Asfaw M on: 10/06/2016 10:31 AM   Modules accepted: Orders

## 2016-10-06 NOTE — Telephone Encounter (Signed)
Ok for referral to [facility] for colon cancer screen- please write that pt prefers Monday appt in scheduling notes.  Thanks!

## 2016-10-06 NOTE — Telephone Encounter (Signed)
Referral entered in EPIC

## 2016-10-06 NOTE — Telephone Encounter (Signed)
Pt asking for a referral to have colonoscopy and would like for this to be with [facility] where her husband went in 2015. Pt also request a Monday appt.

## 2016-10-27 ENCOUNTER — Telehealth: Payer: Self-pay | Admitting: *Deleted

## 2016-10-27 NOTE — Telephone Encounter (Signed)
Needs appt prior to calling in medication- pt has not been seen since December

## 2016-10-27 NOTE — Telephone Encounter (Signed)
Patient states that she has another UTI  - she is asking if something can just be called in since this is something that is recurrent for her, and it hasn't been long since her last one.    If so, she would like [facility] [address]

## 2016-10-27 NOTE — Telephone Encounter (Signed)
Patient stated that she would just try over the counter treatment because she did not have time for an appointment.

## 2016-11-01 ENCOUNTER — Encounter: Payer: Self-pay | Admitting: Family Medicine

## 2016-11-01 ENCOUNTER — Encounter: Payer: Self-pay | Admitting: General Practice

## 2016-11-01 ENCOUNTER — Ambulatory Visit (INDEPENDENT_AMBULATORY_CARE_PROVIDER_SITE_OTHER): Payer: BC Managed Care – PPO | Admitting: Family Medicine

## 2016-11-01 VITALS — BP 124/81 | HR 104 | Temp 98.1°F | Resp 16 | Ht 68.0 in | Wt 164.4 lb

## 2016-11-01 DIAGNOSIS — R319 Hematuria, unspecified: Secondary | ICD-10-CM

## 2016-11-01 DIAGNOSIS — R82998 Other abnormal findings in urine: Secondary | ICD-10-CM

## 2016-11-01 DIAGNOSIS — R8299 Other abnormal findings in urine: Secondary | ICD-10-CM | POA: Diagnosis not present

## 2016-11-01 DIAGNOSIS — R300 Dysuria: Secondary | ICD-10-CM

## 2016-11-01 LAB — POCT URINALYSIS DIPSTICK
BILIRUBIN UA: NEGATIVE
GLUCOSE UA: NEGATIVE
KETONES UA: NEGATIVE
Nitrite, UA: NEGATIVE
Urobilinogen, UA: 0.2 E.U./dL
pH, UA: 6 (ref 5.0–8.0)

## 2016-11-01 MED ORDER — CEPHALEXIN 500 MG PO CAPS: 500.0000 mg | ORAL_CAPSULE | Freq: Two times a day (BID) | ORAL | 0 refills | Status: DC

## 2016-11-01 NOTE — Progress Notes (Signed)
   Subjective:    Patient ID: Alison Anderson, female    DOB: [birth year], 61 y.o.   MRN: [id number]  HPI UTI- pt reports some burning and urgency associated w/ pelvic pressure/dull pain.  sxs started 'a couple of days ago'.  Pt reports increased pain and urgency/frequency on Sunday but very little urine output.  Has a 'pulling sensation' w/ urination.   Review of Systems For ROS see HPI     Objective:   Physical Exam  Constitutional: She is oriented to person, place, and time. She appears well-developed and well-nourished. No distress.  HENT:  Head: Normocephalic and atraumatic.  Abdominal: Soft. She exhibits no distension. There is no tenderness (no suprapubic or CVA tenderness).  Neurological: She is alert and oriented to person, place, and time.  Skin: Skin is warm and dry. No rash noted. No erythema.  Psychiatric: She has a normal mood and affect. Her behavior is normal. Thought content normal.  Vitals reviewed.         Assessment & Plan:  UTI- pt's sxs are consistent w/ UTI.  UA is not overwhelming for infxn but sxs are highly suspicious.  Start Keflex BID x5 days.  Await cx results and adjust meds prn.  Pt expressed understanding and is in agreement w/ plan.

## 2016-11-01 NOTE — Progress Notes (Signed)
Pre visit review using our clinic review tool, if applicable. No additional management support is needed unless otherwise documented below in the visit note. 

## 2016-11-01 NOTE — Patient Instructions (Signed)
Follow up as needed/scheduled Start the Keflex twice daily Drink plenty of fluids Call with any questions or concerns Hang in there!!!

## 2016-11-02 ENCOUNTER — Other Ambulatory Visit: Payer: Self-pay | Admitting: General Practice

## 2016-11-02 MED ORDER — MELOXICAM 15 MG PO TABS: 15.0000 mg | ORAL_TABLET | Freq: Every day | ORAL | 0 refills | Status: DC

## 2016-11-04 ENCOUNTER — Ambulatory Visit: Payer: BC Managed Care – PPO | Admitting: Family Medicine

## 2016-11-04 LAB — URINE CULTURE

## 2016-11-08 ENCOUNTER — Other Ambulatory Visit: Payer: Self-pay | Admitting: Family Medicine

## 2016-11-08 ENCOUNTER — Encounter: Payer: BC Managed Care – PPO | Admitting: Gastroenterology

## 2016-11-08 ENCOUNTER — Telehealth: Payer: Self-pay | Admitting: *Deleted

## 2016-11-08 MED ORDER — CEPHALEXIN 500 MG PO CAPS: 500.0000 mg | ORAL_CAPSULE | Freq: Two times a day (BID) | ORAL | 0 refills | Status: DC

## 2016-11-08 NOTE — Telephone Encounter (Signed)
Patient calling in stating that she is starting to feel better since the antibiotic (cephalexin), but she is not totally better. She is asking if she could get a few more days called in, or if she needed to take something else.  She is still having some issues with painful urination, and she feels like if she had a few more days of antibiotic she would be much better.

## 2016-11-08 NOTE — Telephone Encounter (Signed)
Called pt and LMOVM to inform that meds were filled and that if symptoms persisted she would need an appt for repeat UA and referral to Urology.

## 2016-11-08 NOTE — Telephone Encounter (Signed)
Ok for 5 more days of Keflex- BID, #10, no refills.  If still having sxs after this, will need repeat UA and/or referral to urology

## 2016-11-16 ENCOUNTER — Ambulatory Visit (AMBULATORY_SURGERY_CENTER): Payer: Self-pay | Admitting: *Deleted

## 2016-11-16 VITALS — Ht 68.0 in | Wt 163.8 lb

## 2016-11-16 DIAGNOSIS — Z1211 Encounter for screening for malignant neoplasm of colon: Secondary | ICD-10-CM

## 2016-11-16 MED ORDER — NA SULFATE-K SULFATE-MG SULF 17.5-3.13-1.6 GM/177ML PO SOLN: 1.0000 | Freq: Once | ORAL | 0 refills | Status: AC

## 2016-11-16 NOTE — Progress Notes (Signed)
No egg or soy allergy known to patient  No issues with past sedation with any surgeries  or procedures, no intubation problems  No diet pills per patient No home 02 use per patient  No blood thinners per patient  Pt denies issues with constipation  No A fib or A flutter  EMMI video sent to pt's e mail  

## 2016-11-18 ENCOUNTER — Encounter: Payer: Self-pay | Admitting: Gastroenterology

## 2016-11-18 ENCOUNTER — Telehealth: Payer: Self-pay | Admitting: Family Medicine

## 2016-11-18 NOTE — Telephone Encounter (Signed)
Opened in error

## 2016-11-23 ENCOUNTER — Telehealth: Payer: Self-pay | Admitting: Gastroenterology

## 2016-11-23 ENCOUNTER — Encounter: Payer: Self-pay | Admitting: Gastroenterology

## 2016-11-23 ENCOUNTER — Other Ambulatory Visit: Payer: Self-pay

## 2016-11-23 NOTE — Telephone Encounter (Signed)
Letter at the front for pick up. The patient is aware.

## 2016-11-30 ENCOUNTER — Encounter: Payer: Self-pay | Admitting: Gastroenterology

## 2016-11-30 ENCOUNTER — Ambulatory Visit (AMBULATORY_SURGERY_CENTER): Payer: BC Managed Care – PPO | Admitting: Gastroenterology

## 2016-11-30 VITALS — BP 143/77 | HR 89 | Temp 98.4°F | Resp 13 | Ht 68.0 in | Wt 163.0 lb

## 2016-11-30 DIAGNOSIS — Z1212 Encounter for screening for malignant neoplasm of rectum: Secondary | ICD-10-CM | POA: Diagnosis not present

## 2016-11-30 DIAGNOSIS — Z1211 Encounter for screening for malignant neoplasm of colon: Secondary | ICD-10-CM | POA: Diagnosis present

## 2016-11-30 MED ORDER — SODIUM CHLORIDE 0.9 % IV SOLN: 500.0000 mL | INTRAVENOUS | Status: DC

## 2016-11-30 NOTE — Progress Notes (Signed)
  [facility] Anesthesia Post-op Note  Patient: Enzo MontgomeryKaren F Fretwell  Procedure(s) Performed: colonoscopy  Patient Location: [facility] - Recovery Area  Anesthesia Type: Deep Sedation/Propofol  Level of Consciousness: awake, oriented and patient cooperative  Airway and Oxygen Therapy: Patient Spontanous Breathing  Post-op Pain: none  Post-op Assessment:  Post-op Vital signs reviewed, Patient's Cardiovascular Status Stable, Respiratory Function Stable, Patent Airway, No signs of Nausea or vomiting and Pain level controlled  Post-op Vital Signs: Reviewed and stable  Complications: No apparent anesthesia complications  Shalicia Craghead E Jule Whitsel 4:00 PM

## 2016-11-30 NOTE — Progress Notes (Signed)
No problems noted in the recovery room. maw 

## 2016-11-30 NOTE — Patient Instructions (Signed)
YOU HAD AN ENDOSCOPIC PROCEDURE TODAY AT THE [facility]:   Refer to the procedure report that was given to you for any specific questions about what was found during the examination.  If the procedure report does not answer your questions, please call your gastroenterologist to clarify.  If you requested that your care partner not be given the details of your procedure findings, then the procedure report has been included in a sealed envelope for you to review at your convenience later.  YOU SHOULD EXPECT: Some feelings of bloating in the abdomen. Passage of more gas than usual.  Walking can help get rid of the air that was put into your GI tract during the procedure and reduce the bloating. If you had a lower endoscopy (such as a colonoscopy or flexible sigmoidoscopy) you may notice spotting of blood in your stool or on the toilet paper. If you underwent a bowel prep for your procedure, you may not have a normal bowel movement for a few days.  Please Note:  You might notice some irritation and congestion in your nose or some drainage.  This is from the oxygen used during your procedure.  There is no need for concern and it should clear up in a day or so.  SYMPTOMS TO REPORT IMMEDIATELY:   Following lower endoscopy (colonoscopy or flexible sigmoidoscopy):  Excessive amounts of blood in the stool  Significant tenderness or worsening of abdominal pains  Swelling of the abdomen that is new, acute  Fever of 100F or higher    For urgent or emergent issues, a gastroenterologist can be reached at any hour by calling [phone].   DIET:  We do recommend a small meal at first, but then you may proceed to your regular diet.  Drink plenty of fluids but you should avoid alcoholic beverages for 24 hours.  ACTIVITY:  You should plan to take it easy for the rest of today and you should NOT DRIVE or use heavy machinery until tomorrow (because of the sedation medicines used during the test).     FOLLOW UP: Our staff will call the number listed on your records the next business day following your procedure to check on you and address any questions or concerns that you may have regarding the information given to you following your procedure. If we do not reach you, we will leave a message.  However, if you are feeling well and you are not experiencing any problems, there is no need to return our call.  We will assume that you have returned to your regular daily activities without incident.  If any biopsies were taken you will be contacted by phone or by letter within the next 1-3 weeks.  Please call us at [phone] if you have not heard about the biopsies in 3 weeks.    SIGNATURES/CONFIDENTIALITY: You and/or your care partner have signed paperwork which will be entered into your electronic medical record.  These signatures attest to the fact that that the information above on your After Visit Summary has been reviewed and is understood.  Full responsibility of the confidentiality of this discharge information lies with you and/or your care-partner.   Handout was given to your care partner on hemorrhoids. You may resume your current medications today. Repeat screening colonoscopy in 10 years. Please call if any questions or concerns.   

## 2016-11-30 NOTE — Op Note (Signed)
[facility] Patient Name: Alison Anderson Procedure Date: 11/30/2016 3:09 PM MRN: [id number] Endoscopist: Napoleon Form , MD Age: 61 Referring MD:  Date of Birth: 1955-08-29 Gender: Female Account #: [id number] Procedure:                Colonoscopy Indications:              Screening for colorectal malignant neoplasm, This                            is the patient's first colonoscopy Medicines:                Monitored Anesthesia Care Procedure:                Pre-Anesthesia Assessment:                           - Prior to the procedure, a History and Physical                            was performed, and patient medications and                            allergies were reviewed. The patient's tolerance of                            previous anesthesia was also reviewed. The risks                            and benefits of the procedure and the sedation                            options and risks were discussed with the patient.                            All questions were answered, and informed consent                            was obtained. Prior Anticoagulants: The patient has                            taken no previous anticoagulant or antiplatelet                            agents. ASA Grade Assessment: II - A patient with                            mild systemic disease. After reviewing the risks                            and benefits, the patient was deemed in                            satisfactory condition to undergo the procedure.  After obtaining informed consent, the colonoscope                            was passed under direct vision. Throughout the                            procedure, the patient's blood pressure, pulse, and                            oxygen saturations were monitored continuously. The                            Model CF-HQ190L [id number]) scope was introduced                            through the anus and  advanced to the the terminal                            ileum, with identification of the appendiceal                            orifice and IC valve. The colonoscopy was performed                            without difficulty. The patient tolerated the                            procedure well. The quality of the bowel                            preparation was good. The terminal ileum, ileocecal                            valve, appendiceal orifice, and rectum were                            photographed. Scope In: 3:30:57 PM Scope Out: 3:52:16 PM Scope Withdrawal Time: 0 hours 12 minutes 35 seconds  Total Procedure Duration: 0 hours 21 minutes 19 seconds  Findings:                 The perianal and digital rectal examinations were                            normal.                           Non-bleeding internal hemorrhoids were found during                            retroflexion. The hemorrhoids were small.                           The exam was otherwise without abnormality. Complications:            No immediate complications. Estimated Blood  Loss:     Estimated blood loss: none. Impression:               - Non-bleeding internal hemorrhoids.                           - The examination was otherwise normal.                           - No specimens collected. Recommendation:           - Patient has a contact number available for                            emergencies. The signs and symptoms of potential                            delayed complications were discussed with the                            patient. Return to normal activities tomorrow.                            Written discharge instructions were provided to the                            patient.                           - Resume previous diet.                           - Continue present medications.                           - Repeat colonoscopy in 10 years for screening                             purposes. Napoleon FormKavitha V. Nandigam, MD 11/30/2016 4:04:16 PM This report has been signed electronically.

## 2016-12-01 ENCOUNTER — Telehealth: Payer: Self-pay | Admitting: *Deleted

## 2016-12-01 ENCOUNTER — Telehealth: Payer: Self-pay

## 2016-12-01 NOTE — Telephone Encounter (Signed)
  Follow up Call-  Call back number 11/30/2016  Post procedure Call Back phone  # [phone]  Permission to leave phone message Yes  Some recent data might be hidden     No answer, left message.

## 2016-12-01 NOTE — Telephone Encounter (Signed)
Left a message at [phone] for the pt.  Follow up call.  maw

## 2016-12-07 NOTE — Telephone Encounter (Signed)
Error

## 2017-02-17 ENCOUNTER — Other Ambulatory Visit: Payer: Self-pay | Admitting: Family Medicine

## 2017-02-17 NOTE — Telephone Encounter (Signed)
Please advise if ok to fill?  

## 2017-02-24 ENCOUNTER — Telehealth: Payer: Self-pay | Admitting: Family Medicine

## 2017-02-24 NOTE — Telephone Encounter (Signed)
Could you call pt to find out what leg is hurting her and if she remembers the name of the ortho office? I do not see a referral in the system. Pt had previously seen Dr. Pearletha ForgeHudnall?

## 2017-02-24 NOTE — Telephone Encounter (Signed)
Patient requesting referral to see the orthopedic specialist she saw previously for leg pain.  She states she can not remember the name of the doctor she saw before.  She is having leg pain again and would like to see ortho for this.

## 2017-02-24 NOTE — Telephone Encounter (Signed)
Patient states that this was Dr. Pearletha ForgeHudnall.    She could not remember his name the first time. She is going to call their office and see if they will schedule her since she was just there last year.  If a referral is needed, she will call back and let us know.

## 2017-02-24 NOTE — Telephone Encounter (Signed)
Left message for patient to call back to see if she remembers the name of the office that she previously went to as we do not have previous referral in the system.

## 2017-02-28 ENCOUNTER — Ambulatory Visit: Payer: BC Managed Care – PPO | Admitting: Family Medicine

## 2017-03-01 ENCOUNTER — Ambulatory Visit (INDEPENDENT_AMBULATORY_CARE_PROVIDER_SITE_OTHER): Payer: BC Managed Care – PPO | Admitting: Family Medicine

## 2017-03-01 DIAGNOSIS — M79661 Pain in right lower leg: Secondary | ICD-10-CM

## 2017-03-01 NOTE — Assessment & Plan Note (Signed)
consistent with overuse grade 1 calf strain. Exam otherwise reassuring.  Compression sleeve.  Ice/heat.  Heel lifts.  Tylenol or aleve.  Shown home exercises to do daily.  F/u in 4 weeks if not improving.

## 2017-03-01 NOTE — Patient Instructions (Signed)
You have a grade 1 calf strain. Compression sleeve or ace wrap for support while this is sore. Icing or heat (whichever feels better) for 15 minutes at a time 3-4 times a day Consider heel lifts to help unload the calf muscle. Tylenol and/or aleve for pain. Start theraband strengthening exercises and single leg calf raise 3 sets of 10 once a day. Expect this to take 2-4 weeks to resolve. Follow up with me in 4 weeks if you're not improving as expected.

## 2017-03-01 NOTE — Progress Notes (Signed)
PCP: Sheliah Hatch, MD  Subjective:   HPI: Patient is a 61 y.o. female here for right calf pain.  Patient denies known injury or trauma. She states about 5 days ago she had been standing and walking more than usual and started to get pain posterior right lower leg the next morning. No swelling or bruising. Pain up to 5/10, sharp and sore. Worse with stooping, bending. Better once she gets going but worse in morning. No skin changes, numbness.  Past Medical History:  Diagnosis Date  . Lumbar herniated disc   . Menopause    at 22    Current Outpatient Prescriptions on File Prior to Visit  Medication Sig Dispense Refill  . acetaminophen (TYLENOL) 500 MG tablet Take 1,000 mg by mouth every 6 (six) hours as needed.    . cyclobenzaprine (FLEXERIL) 10 MG tablet TAKE 1 TABLET BY MOUTH EVERY 8 HOURS AS NEEDED 60 tablet 1  . Loperamide HCl (IMODIUM PO) Take by mouth as needed.    . meloxicam (MOBIC) 15 MG tablet TAKE 1 TABLET BY MOUTH EVERY DAY 90 tablet 0  . Multiple Vitamins-Minerals (MULTIVITAMINS) CHEW Chew 1 each by mouth daily.     Current Facility-Administered Medications on File Prior to Visit  Medication Dose Route Frequency Provider Last Rate Last Dose  . 0.9 %  sodium chloride infusion  500 mL Intravenous Continuous Nandigam, Eleonore Chiquito, MD        Past Surgical History:  Procedure Laterality Date  . ABDOMINAL HYSTERECTOMY  2012  . back surgery  05/2016   herniated disc, pinched nerve   . CHOLECYSTECTOMY      Allergies  Allergen Reactions  . Tramadol Itching and Rash    Social History   Social History  . Marital status: Married    Spouse name: N/A  . Number of children: N/A  . Years of education: N/A   Occupational History  . Not on file.   Social History Main Topics  . Smoking status: Never Smoker  . Smokeless tobacco: Never Used  . Alcohol use 0.0 oz/week     Comment: rarely  . Drug use: No  . Sexual activity: Not on file   Other Topics  Concern  . Not on file   Social History Narrative   Lives with husband and daughter Baxter Hire   Works as a Midwife at [employer] History  Problem Relation Age of Onset  . Diabetes Mother   . Heart disease Mother   . Hypertension Mother   . Diabetes Father        borderline  . Cancer Maternal Grandmother        breast  . Breast cancer Maternal Grandmother   . Stroke Unknown        grandparent  . Breast cancer Unknown        grandmother  . Colon cancer Neg Hx   . Colon polyps Neg Hx   . Esophageal cancer Neg Hx   . Rectal cancer Neg Hx   . Stomach cancer Neg Hx     There were no vitals taken for this visit.  Review of Systems: See HPI above.     Objective:  Physical Exam:  Gen: NAD, comfortable in exam room  Right lower leg: No gross deformity, swelling, bruising.  No palpable cords. TTP mildly lateral gastroc, anterior tibialis.  No other tenderness of knee, ankle, lower leg. FROM knee and ankle without pain. 5/5 strength with all ankle motions,  knee motions. No pain with calf raise. NVI distally.   Assessment & Plan:  1. Right leg pain - consistent with overuse grade 1 calf strain. Exam otherwise reassuring.  Compression sleeve.  Ice/heat.  Heel lifts.  Tylenol or aleve.  Shown home exercises to do daily.  F/u in 4 weeks if not improving.

## 2017-07-01 ENCOUNTER — Telehealth: Payer: Self-pay | Admitting: Family Medicine

## 2017-07-01 NOTE — Telephone Encounter (Signed)
Left message for pt. To call to discuss her symptoms and need for office visit.

## 2017-07-01 NOTE — Telephone Encounter (Signed)
Copied from CRM [id number]. Topic: Quick Communication - See Telephone Encounter >> Jul 01, 2017  2:02 PM Terisa Starraylor, Brittany L wrote: CRM for notification. See Telephone encounter for:   07/01/17.  Pt states she is getting a UTI & said she has gotten them before & she wants to know if Dr. Beverely Lowabori would call her something into [facility] on [address].

## 2017-07-01 NOTE — Telephone Encounter (Signed)
PEC attempted to contact patient to discuss symptoms / Will forward to office for followup

## 2017-07-01 NOTE — Telephone Encounter (Signed)
LM for patient to call to speak to triage nurse.

## 2017-07-04 ENCOUNTER — Other Ambulatory Visit: Payer: Self-pay | Admitting: Family Medicine

## 2017-07-18 ENCOUNTER — Ambulatory Visit: Payer: BC Managed Care – PPO | Admitting: Family Medicine

## 2017-07-18 ENCOUNTER — Other Ambulatory Visit: Payer: Self-pay

## 2017-07-18 ENCOUNTER — Encounter: Payer: Self-pay | Admitting: Family Medicine

## 2017-07-18 VITALS — BP 131/82 | HR 117 | Temp 98.1°F | Resp 16 | Ht 68.0 in | Wt 162.0 lb

## 2017-07-18 DIAGNOSIS — M5441 Lumbago with sciatica, right side: Secondary | ICD-10-CM

## 2017-07-18 DIAGNOSIS — R300 Dysuria: Secondary | ICD-10-CM | POA: Diagnosis not present

## 2017-07-18 DIAGNOSIS — G8929 Other chronic pain: Secondary | ICD-10-CM | POA: Diagnosis not present

## 2017-07-18 LAB — POCT URINALYSIS DIPSTICK
Bilirubin, UA: NEGATIVE
Glucose, UA: NEGATIVE
Ketones, UA: NEGATIVE
LEUKOCYTES UA: NEGATIVE
NITRITE UA: NEGATIVE
PH UA: 6 (ref 5.0–8.0)
PROTEIN UA: NEGATIVE
RBC UA: NEGATIVE
SPEC GRAV UA: 1.02 (ref 1.010–1.025)
UROBILINOGEN UA: 0.2 U/dL

## 2017-07-18 MED ORDER — CYCLOBENZAPRINE HCL 10 MG PO TABS: 10.0000 mg | ORAL_TABLET | Freq: Three times a day (TID) | ORAL | 1 refills | Status: DC | PRN

## 2017-07-18 MED ORDER — MELOXICAM 15 MG PO TABS: 15.0000 mg | ORAL_TABLET | Freq: Every day | ORAL | 1 refills | Status: DC

## 2017-07-18 NOTE — Patient Instructions (Signed)
Schedule your complete physical at your convenience We'll notify you of your urine culture and start antibiotics if needed We'll be happy to maintain your Flexeril and Meloxicam- let me know when you need refills Call with any questions or concerns Happy New Year!!

## 2017-07-18 NOTE — Progress Notes (Signed)
   Subjective:    Patient ID: Alison Anderson, female    DOB: July 06, 1956, 62 y.o.   MRN: [id number]  HPI Dysuria- pt reports that last week she had some burning w/ urination and took OTC medication with improvement in sxs.  Pt reports she has not taken meds in 4-5 days and is having the return of some frequency and urgency.  No dysuria.  No suprapubic or CVA tenderness.  No fevers.  Hx of UTI.  Chronic back pain- pt has been following w/ Dr Yetta BarreJones (neurosurgery) and he has been maintaining her on Flexeril and Meloxicam.  He told her that as of the end of this prescription, he will no longer be filling it b/c 'it has been a year' and told her to speak w/ PCP.   Review of Systems For ROS see HPI     Objective:   Physical Exam  Constitutional: She is oriented to person, place, and time. She appears well-developed and well-nourished. No distress.  HENT:  Head: Normocephalic and atraumatic.  Abdominal: Soft. She exhibits no distension. There is no tenderness (no suprapubic or CVA tenderness).  Neurological: She is alert and oriented to person, place, and time.  Psychiatric: She has a normal mood and affect. Her behavior is normal. Thought content normal.  Vitals reviewed.         Assessment & Plan:  Urinary frequency (dysuria last week)- UA is completely normal.  Given sxs, will send for cx and treat if needed.  Pt expressed understanding and is in agreement w/ plan.

## 2017-07-18 NOTE — Assessment & Plan Note (Signed)
Chronic problem.  Has been following w/ neurosurg who states that since it has been over a year, I will need to maintain her Meloxicam and Flexeril.  Prescriptions sent.

## 2017-07-21 ENCOUNTER — Other Ambulatory Visit: Payer: Self-pay | Admitting: General Practice

## 2017-07-21 LAB — URINE CULTURE
MICRO NUMBER:: 90023849
SPECIMEN QUALITY: ADEQUATE

## 2017-07-21 MED ORDER — CEPHALEXIN 500 MG PO CAPS: 500.0000 mg | ORAL_CAPSULE | Freq: Two times a day (BID) | ORAL | 0 refills | Status: DC

## 2017-08-12 ENCOUNTER — Telehealth: Payer: Self-pay | Admitting: Family Medicine

## 2017-08-12 NOTE — Telephone Encounter (Signed)
Copied from CRM [id number]. Topic: Quick Communication - See Telephone Encounter >> Aug 12, 2017  4:06 PM Terisa Starraylor, Brittany L wrote: CRM for notification. See Telephone encounter for:   08/12/17.  Patient said she is running out too soon on her cyclobenzaprine (FLEXERIL) 10 MG tablet  She wants to know if she can have it back to the way she originally had it prescribed to her. She said she is running out before it is time to fill it again. She said she is waking up in the middle of the night with leg cramps.  Call back [phone]

## 2017-08-12 NOTE — Telephone Encounter (Signed)
Please advise 

## 2017-08-15 NOTE — Telephone Encounter (Signed)
Pt is getting 90 flexeril monthly so this means she is taking MORE than the prescribed 3/day if she is running out early.  There is no way I am prescribing more than 90 per month and if she is requiring 3 each day she needs an appt as this seems excessive

## 2017-08-15 NOTE — Telephone Encounter (Signed)
Called pt and left a detailed message to inform of PCP recommendations. If pt is exceeding 3 tblets per day she will need an OV.   [facility] / Schedule patient.

## 2017-08-22 ENCOUNTER — Telehealth: Payer: Self-pay | Admitting: *Deleted

## 2017-08-22 ENCOUNTER — Telehealth: Payer: Self-pay | Admitting: Family Medicine

## 2017-08-22 MED ORDER — CEPHALEXIN 500 MG PO CAPS: 500.0000 mg | ORAL_CAPSULE | Freq: Two times a day (BID) | ORAL | 0 refills | Status: DC

## 2017-08-22 NOTE — Telephone Encounter (Signed)
Copied from CRM [phone]. Topic: General - Other >> Aug 22, 2017  9:13 AM Lelon FrohlichGolden, Mills Mitton, RMA wrote: Reason for CRM: pt called and would like a nurse to call her back [phone] stated that she is still having UTI symptoms and would like to know if she can leave another sample and have a stronger antibiotic called in without having to make an appointment. Pt stated that she is taking an OTC medication for her symptoms

## 2017-08-22 NOTE — Telephone Encounter (Signed)
Pt informed and expressed an understanding that she will come in if no better

## 2017-08-22 NOTE — Telephone Encounter (Signed)
See other phone note regarding this

## 2017-08-22 NOTE — Telephone Encounter (Signed)
Urine is sensitive to the abx (Keflex) prescribed.  If she is still having symptoms, we can send 5 more days of Keflex BID (#10) but if still having sxs after that, will need OV.

## 2017-08-22 NOTE — Addendum Note (Signed)
Addended by: Geannie RisenBRODMERKEL, JESSICA L on: 08/22/2017 12:01 PM   Modules accepted: Orders

## 2017-08-22 NOTE — Telephone Encounter (Signed)
ABX were called in on Thursday for 5 days.  Pt still having the same symptoms.  She is also taking OTC medications with no relief.   Pt. Wanting to know if she has to be seen again in order for her to get another abx?   Routed to provider to advise   Copied from CRM [phone]. Topic: General - Other >> Aug 22, 2017  9:13 AM Lelon FrohlichGolden, Tashia, RMA wrote: Reason for CRM: pt called and would like a nurse to call her back [phone] stated that she is still having UTI symptoms and would like to know if she can leave another sample and have a stronger antibiotic called in without having to make an appointment. Pt stated that she is taking an OTC medication for her symptoms

## 2017-08-25 IMAGING — DX DG FOOT COMPLETE 3+V*L*
3 series · 3 of 3 positions shown · non-contrast
Comparison: None in PACs

CLINICAL DATA: Left lateral foot pain centered over the fourth and
fifth metatarsal bases without known injury

EXAM:
LEFT FOOT - COMPLETE 3+ VIEW

[foot ap]
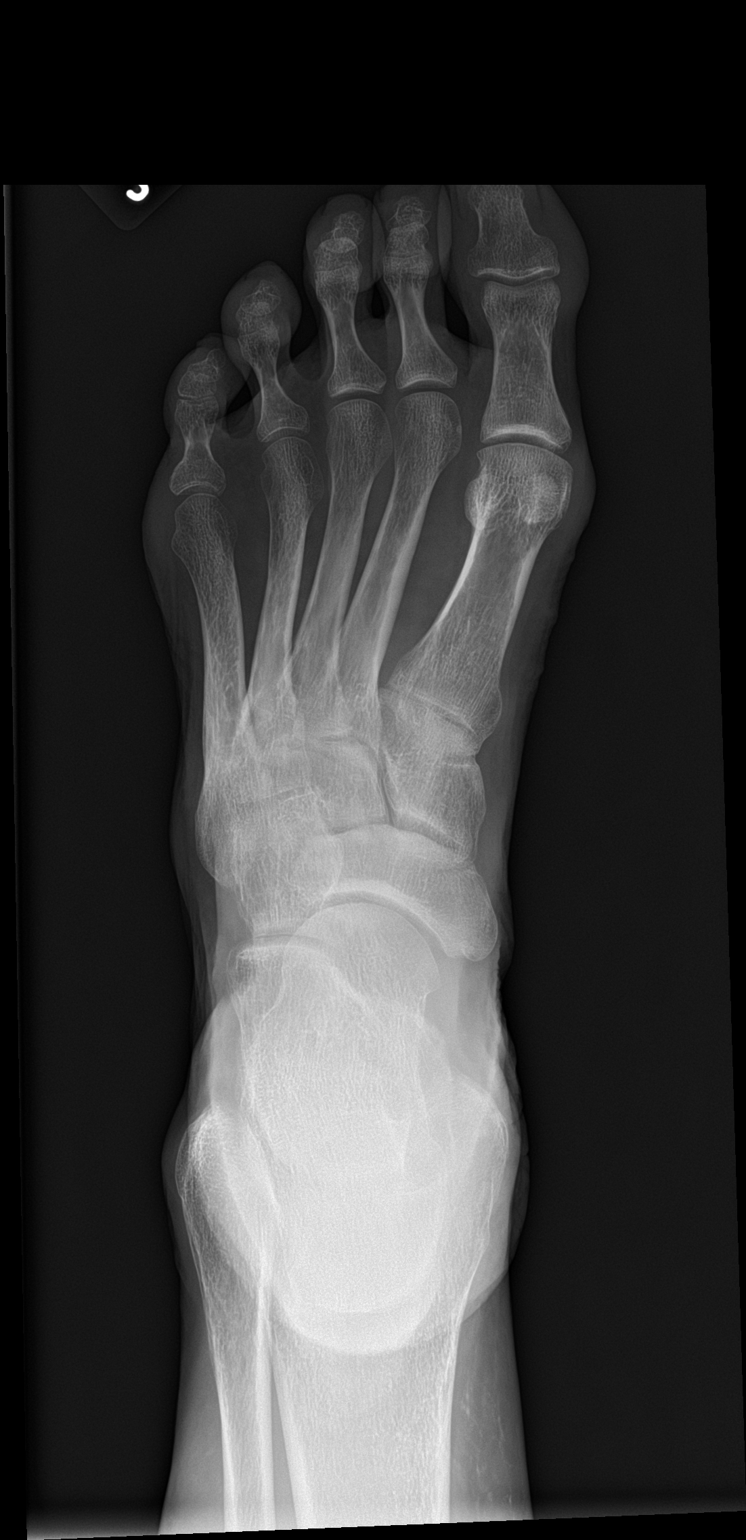

[foot obl]
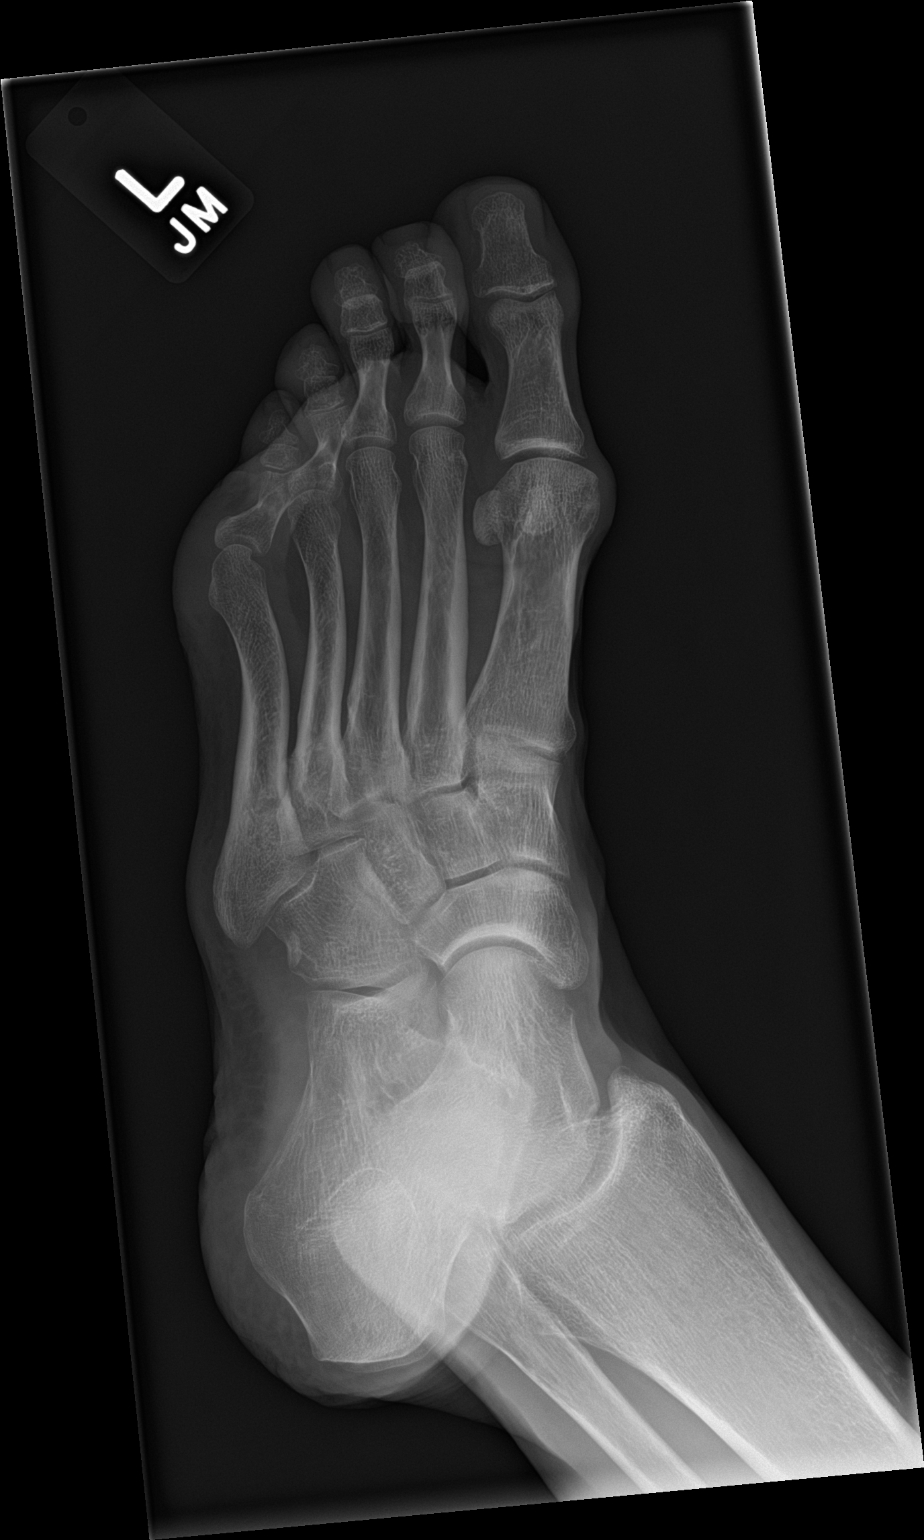

[foot lat]
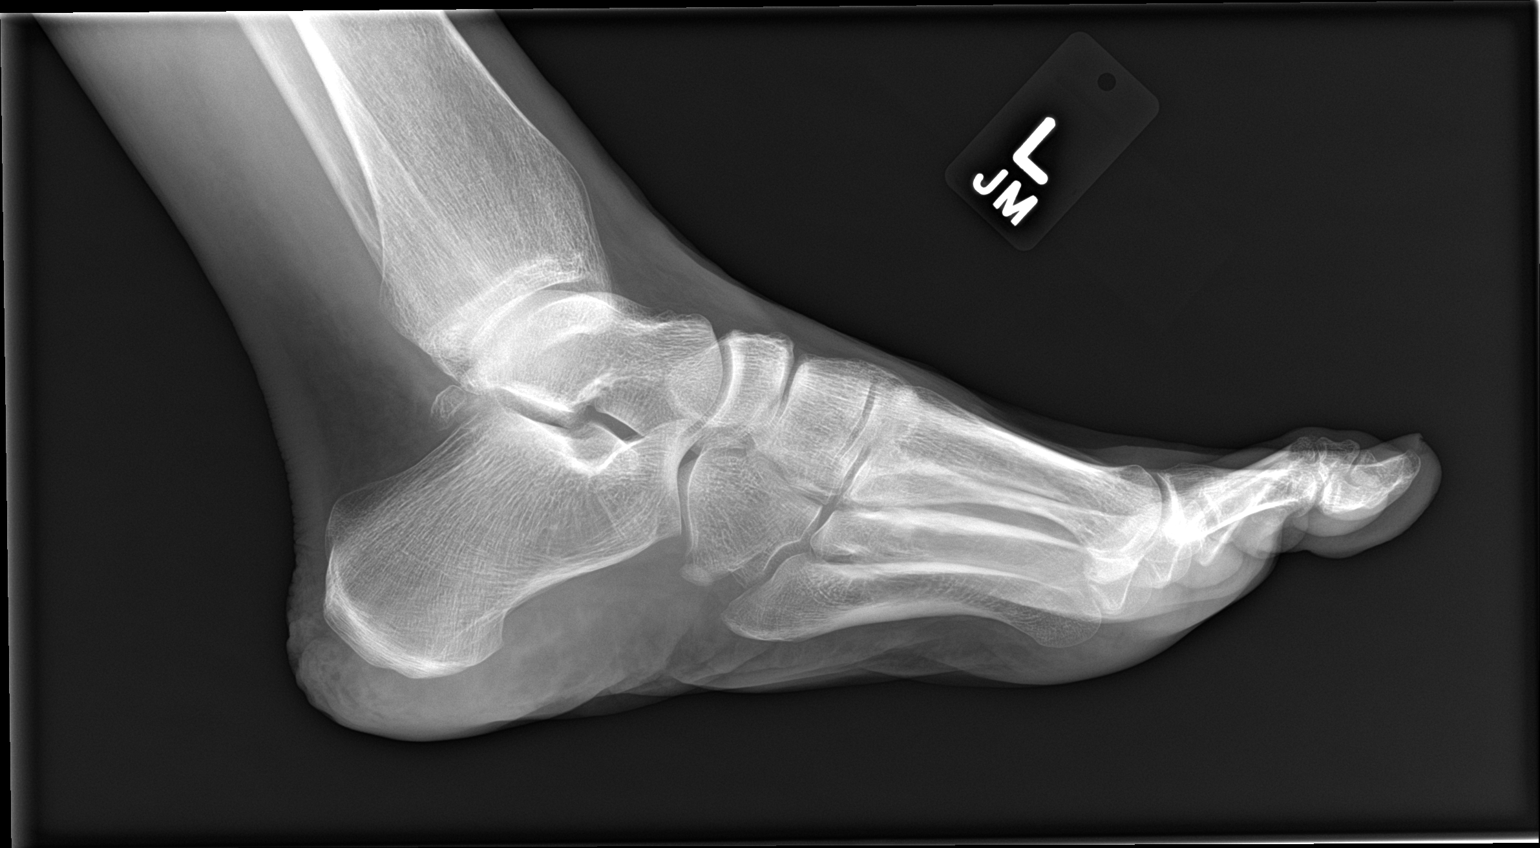

[3 of 3 positions shown; findings below may reference images not displayed]

FINDINGS: The bones of the foot are adequately mineralized for age. There is
no acute or healing fracture. Specific attention to the fourth and
fifth metatarsals reveals no acute abnormality. The joint spaces are
reasonably well maintained throughout the foot. There is mild soft
tissue prominence over the base of the fifth metatarsal laterally
but this may be normal for the patient.
IMPRESSION: There is no acute bony abnormality of the left foot. If the
patient's symptoms persist and remain unexplained, further
evaluation with MRI may be useful.

## 2017-09-14 ENCOUNTER — Other Ambulatory Visit: Payer: Self-pay | Admitting: Family Medicine

## 2017-10-10 ENCOUNTER — Other Ambulatory Visit: Payer: Self-pay | Admitting: Family Medicine

## 2017-10-10 DIAGNOSIS — Z1231 Encounter for screening mammogram for malignant neoplasm of breast: Secondary | ICD-10-CM

## 2017-10-15 ENCOUNTER — Other Ambulatory Visit: Payer: Self-pay | Admitting: Family Medicine

## 2017-10-17 NOTE — Telephone Encounter (Signed)
Called patient and she stated that she is taking 3 Flexeril a day and if really bad she has taken a 4th but normally just 3 per day. She stated that she is with Brett CanalesSteve and he is waking up from his kidney transplant. She wanted you to know.

## 2017-10-17 NOTE — Telephone Encounter (Signed)
Please ask pt if she is taking 3 pills every day.  Based on her refill request, this seems to be the case.

## 2017-10-17 NOTE — Telephone Encounter (Signed)
Last OV 07/18/2017 Last filled 09/14/2017, #90 w/0 refills

## 2017-11-01 ENCOUNTER — Ambulatory Visit: Payer: BC Managed Care – PPO

## 2017-12-20 ENCOUNTER — Encounter: Payer: Self-pay | Admitting: Medical

## 2017-12-20 ENCOUNTER — Telehealth: Payer: Self-pay | Admitting: Medical

## 2017-12-20 ENCOUNTER — Telehealth: Payer: Self-pay

## 2017-12-20 ENCOUNTER — Ambulatory Visit: Payer: BC Managed Care – PPO | Admitting: Medical

## 2017-12-20 VITALS — BP 137/67 | HR 95 | Temp 98.1°F | Resp 16 | Ht 68.0 in | Wt 165.6 lb

## 2017-12-20 DIAGNOSIS — B351 Tinea unguium: Secondary | ICD-10-CM | POA: Diagnosis not present

## 2017-12-20 DIAGNOSIS — R944 Abnormal results of kidney function studies: Secondary | ICD-10-CM

## 2017-12-20 MED ORDER — CICLOPIROX 8 % EX SOLN: Freq: Every day | CUTANEOUS | 0 refills | Status: DC

## 2017-12-20 MED ORDER — CICLOPIROX 8 % EX SOLN: Freq: Every day | CUTANEOUS | 5 refills | Status: DC

## 2017-12-20 NOTE — Telephone Encounter (Signed)
Ciclopirox not covered- preferred is terbinafine, or itraconazole.

## 2017-12-20 NOTE — Telephone Encounter (Signed)
Future cmp and fungal stain test placed.

## 2017-12-20 NOTE — Telephone Encounter (Signed)
Sorry to hear not covered. Would have preferred topical rather than oral. You could let pt know that cash cost in our pharmacy for approximate one month supply is about $30 dollars. May need to be on med for 3-6 months then nail grows out slowly and then we found out if it worked.  The other option is what her insurance prefers which is oral lamisil. But can rarely raise liver enzymes. So would recommend getting cmp before starting medication. And then follow up with pcp or here in 2 months. Might need repeat liver enzymes after med started.   Also when I write lamisil I do like to get fungal stain to confirm likely diagnosis of fungal infection due to potential med side effects.  She can schedule cmp. I put in future order. Also when she comes in for cmp would ask that she go ahead and clip decently wide and long portion of one of the thicker nails so we can use to send out for fungal stain.  Would plan to write lamisil rx after cmp and fungal stain back.   Sorry for inconvenience. Surprised generic penlac not covered?  Thanks

## 2017-12-20 NOTE — Telephone Encounter (Signed)
Opened to review 

## 2017-12-20 NOTE — Telephone Encounter (Signed)
Would you help out and call downstairs to see what rx of generic penlac/ciclopirox might cost. Cash price. Trying to get prior authorization but wanted to see by chance what cash price generic might be.See the rx I wrote.

## 2017-12-20 NOTE — Progress Notes (Signed)
Subjective:    Patient ID: Alison Anderson, female    DOB: 06-Nov-1955, 62 y.o.   MRN: [id number]  HPI  Pt in for evaluation.   She has some discoloration to both toenails of both feet. Yellow appearance and thickening. Gradual worsening over past years or more. Pt has been using some topical treatment to toenails. She feels needs prescription rather than otc.    Review of Systems  Constitutional: Negative for chills, fatigue and fever.  Respiratory: Negative for cough, chest tightness, shortness of breath and wheezing.   Cardiovascular: Negative for chest pain and palpitations.  Gastrointestinal: Negative for abdominal pain.  Musculoskeletal: Negative for arthralgias, gait problem, neck pain and neck stiffness.  Skin: Negative for rash.       See hpi.  Neurological: Negative for dizziness, syncope, weakness, numbness and headaches.  Hematological: Negative for adenopathy. Does not bruise/bleed easily.  Psychiatric/Behavioral: Negative for behavioral problems, confusion and suicidal ideas. The patient is not nervous/anxious.     Past Medical History:  Diagnosis Date  . Lumbar herniated disc   . Menopause    at [age]     Social History   Socioeconomic History  . Marital status: Married    Spouse name: Not on file  . Number of children: Not on file  . Years of education: Not on file  . Highest education level: Not on file  Occupational History  . Not on file  Social Needs  . Financial resource strain: Not on file  . Food insecurity:    Worry: Not on file    Inability: Not on file  . Transportation needs:    Medical: Not on file    Non-medical: Not on file  Tobacco Use  . Smoking status: Never Smoker  . Smokeless tobacco: Never Used  Substance and Sexual Activity  . Alcohol use: Yes    Alcohol/week: 0.0 oz    Comment: rarely  . Drug use: No  . Sexual activity: Not on file  Lifestyle  . Physical activity:    Days per week: Not on file    Minutes per session: Not  on file  . Stress: Not on file  Relationships  . Social connections:    Talks on phone: Not on file    Gets together: Not on file    Attends religious service: Not on file    Active member of club or organization: Not on file    Attends meetings of clubs or organizations: Not on file    Relationship status: Not on file  . Intimate partner violence:    Fear of current or ex partner: Not on file    Emotionally abused: Not on file    Physically abused: Not on file    Forced sexual activity: Not on file  Other Topics Concern  . Not on file  Social History Narrative   Lives with husband and daughter Baxter HireKristen   Works as a MidwifeKindergarten Teacher at [employer]    Past Surgical History:  Procedure Laterality Date  . ABDOMINAL HYSTERECTOMY  2012  . back surgery  05/2016   herniated disc, pinched nerve   . CHOLECYSTECTOMY      Family History  Problem Relation Age of Onset  . Diabetes Mother   . Heart disease Mother   . Hypertension Mother   . Diabetes Father        borderline  . Cancer Maternal Grandmother        breast  . Breast cancer Maternal  Grandmother   . Stroke Unknown        grandparent  . Breast cancer Unknown        grandmother  . Colon cancer Neg Hx   . Colon polyps Neg Hx   . Esophageal cancer Neg Hx   . Rectal cancer Neg Hx   . Stomach cancer Neg Hx     Allergies  Allergen Reactions  . Tramadol Itching and Rash    Current Outpatient Medications on File Prior to Visit  Medication Sig Dispense Refill  . acetaminophen (TYLENOL) 500 MG tablet Take 1,000 mg by mouth every 6 (six) hours as needed.    . cyclobenzaprine (FLEXERIL) 10 MG tablet TAKE 1 TABLET (10 MG TOTAL) BY MOUTH EVERY 8 (EIGHT) HOURS AS NEEDED. 90 tablet 1  . Loperamide HCl (IMODIUM PO) Take by mouth as needed.    . meloxicam (MOBIC) 15 MG tablet Take 1 tablet (15 mg total) by mouth daily. 90 tablet 1  . Multiple Vitamins-Minerals (MULTIVITAMINS) CHEW Chew 1 each by mouth daily.     No current  facility-administered medications on file prior to visit.     BP 137/67   Pulse 95   Temp 98.1 F (36.7 C) (Oral)   Resp 16   Ht 5\' 8"  (1.727 m)   Wt 165 lb 9.6 oz (75.1 kg)   SpO2 100%   BMI 25.18 kg/m       Objective:   Physical Exam  General- No acute distress. Pleasant patient. Neck- Full range of motion, no jvd Lungs- Clear, even and unlabored. Heart- regular rate and rhythm. Neurologic- CNII- XII grossly intact.  Feet/skin- toenails slight yellowish color to nails. With thickness to nails.          Assessment & Plan:  You do appear to have toenail fungus. I am prescribing you Penlac topical nail solution.  I did decide to give you 5 refills as you will be applying to all your nails and may run out of sooner.  Hopefully will see improvement over the next 3 to 6 months.  If not significant improvement then can consider oral Lamisil as we discussed today or referral to specialist.  Follow-up as needed.  Esperanza Richters, PA-C

## 2017-12-20 NOTE — Telephone Encounter (Signed)
PA initiated via Covermymeds; KEY: [id number]. Awaiting determination.

## 2017-12-20 NOTE — Patient Instructions (Signed)
You do appear to have toenail fungus. I am prescribing you Penlac topical nail solution.  I did decide to give you 5 refills as you will be applying to all your nails and may run out of sooner.  Hopefully will see improvement over the next 3 to 6 months.  If not significant improvement then can consider oral Lamisil as we discussed today or referral to specialist.  Follow-up as needed.

## 2017-12-20 NOTE — Telephone Encounter (Signed)
Future cmp placed. 

## 2017-12-20 NOTE — Telephone Encounter (Signed)
PA denied. Pt must try at least one oral medicine first.

## 2017-12-21 ENCOUNTER — Telehealth: Payer: Self-pay | Admitting: Medical

## 2017-12-21 NOTE — Telephone Encounter (Signed)
Never mind on long note about lamisil.   She decided to get penlac generic which cost her $30. So she won't come in for cmp or nail fungus testing.

## 2017-12-22 ENCOUNTER — Other Ambulatory Visit: Payer: Self-pay | Admitting: Emergency Medicine

## 2017-12-22 DIAGNOSIS — B351 Tinea unguium: Secondary | ICD-10-CM

## 2017-12-22 NOTE — Telephone Encounter (Signed)
Pt returned call and stated she was ok to use the Penlac 8% nail solution. She said that she can afford this med (29.00).

## 2018-01-03 ENCOUNTER — Other Ambulatory Visit: Payer: Self-pay | Admitting: Family Medicine

## 2018-01-09 ENCOUNTER — Telehealth: Payer: Self-pay | Admitting: Medical

## 2018-01-09 ENCOUNTER — Telehealth: Payer: Self-pay | Admitting: Family Medicine

## 2018-01-09 DIAGNOSIS — B351 Tinea unguium: Secondary | ICD-10-CM

## 2018-01-09 NOTE — Telephone Encounter (Signed)
Pt scheduled 01/11/18

## 2018-01-09 NOTE — Telephone Encounter (Signed)
Copied from CRM [id number]. Topic: Quick Communication - See Telephone Encounter >> Jan 09, 2018  9:11 AM Windy KalataMichael, Eulis Salazar L, NT wrote: CRM for notification. See Telephone encounter for: 01/09/18.  Patient is calling and states she was porescribed ciclopirox (PENLAC) 8 % solution by Consuelo PandyEdward Saguir for toe fungus but she has done some research and talked with a pharmacist and was instructed that medication does not work. She would like to be put on lamisil instead. Patient also states insurance does not cover Ciclopirox. Please advise.   [id number]- [place], [place] - [address]  [place] [id number] Phone: [phone] Fax: [phone]

## 2018-01-09 NOTE — Telephone Encounter (Signed)
Opened to review. Check on fungal study and cmp order.

## 2018-01-09 NOTE — Telephone Encounter (Signed)
Opened to review 

## 2018-01-09 NOTE — Telephone Encounter (Signed)
I can prescribe lamisil but would want her to get cmp/liver enzymes checked before starting. Also if she can bring clipped portion of nail(moderate large clipping) to our lab so we can send out test to confirm fungal infection. If lab normal and fungal test positive then can write lamisil. Please arrange for patient to get scheduled with lab.   Want test to done due to duration of lamisil needed and potential side effects of treatment.

## 2018-01-11 ENCOUNTER — Ambulatory Visit: Payer: BC Managed Care – PPO | Admitting: Medical

## 2018-01-11 ENCOUNTER — Encounter: Payer: Self-pay | Admitting: Medical

## 2018-01-11 ENCOUNTER — Telehealth: Payer: Self-pay | Admitting: Medical

## 2018-01-11 VITALS — BP 115/91 | HR 107 | Temp 98.3°F | Resp 16 | Ht 68.0 in | Wt 161.6 lb

## 2018-01-11 DIAGNOSIS — B351 Tinea unguium: Secondary | ICD-10-CM | POA: Diagnosis not present

## 2018-01-11 DIAGNOSIS — R944 Abnormal results of kidney function studies: Secondary | ICD-10-CM

## 2018-01-11 DIAGNOSIS — E875 Hyperkalemia: Secondary | ICD-10-CM

## 2018-01-11 LAB — COMPREHENSIVE METABOLIC PANEL
ALBUMIN: 4.4 g/dL (ref 3.5–5.2)
ALT: 11 U/L (ref 0–35)
AST: 19 U/L (ref 0–37)
Alkaline Phosphatase: 58 U/L (ref 39–117)
BUN: 27 mg/dL — AB (ref 6–23)
CO2: 29 mEq/L (ref 19–32)
CREATININE: 1.1 mg/dL (ref 0.40–1.20)
Calcium: 9.7 mg/dL (ref 8.4–10.5)
Chloride: 107 mEq/L (ref 96–112)
GFR: 53.67 mL/min — ABNORMAL LOW (ref >60.00)
Glucose, Bld: 99 mg/dL (ref 70–99)
Potassium: 5.9 mEq/L — ABNORMAL HIGH (ref 3.5–5.1)
SODIUM: 142 meq/L (ref 135–145)
TOTAL PROTEIN: 6.5 g/dL (ref 6.0–8.3)
Total Bilirubin: 0.4 mg/dL (ref 0.2–1.2)

## 2018-01-11 MED ORDER — TERBINAFINE HCL 250 MG PO TABS: ORAL_TABLET | ORAL | 0 refills | Status: DC

## 2018-01-11 NOTE — Progress Notes (Signed)
Subjective:    Patient ID: ISYS TIETJE, female    DOB: 1956/05/26, 62 y.o.   MRN: [id number]  HPI   Pt in for evaluation.  She does not want to use penlac any more. She has investigated the efficacy and wants treatment more likely to resolve fungal infection of nails.  No hx of liver enzyme elevation that she knows of.   Review of Systems  Constitutional: Negative for chills and fatigue.  Respiratory: Negative for cough, chest tightness, shortness of breath and wheezing.   Cardiovascular: Negative for chest pain and palpitations.  Gastrointestinal: Negative for abdominal pain.  Hematological: Negative for adenopathy. Does not bruise/bleed easily.  Psychiatric/Behavioral: Negative for behavioral problems and confusion.   Past Medical History:  Diagnosis Date  . Lumbar herniated disc   . Menopause    at 75     Social History   Socioeconomic History  . Marital status: Married    Spouse name: Not on file  . Number of children: Not on file  . Years of education: Not on file  . Highest education level: Not on file  Occupational History  . Not on file  Social Needs  . Financial resource strain: Not on file  . Food insecurity:    Worry: Not on file    Inability: Not on file  . Transportation needs:    Medical: Not on file    Non-medical: Not on file  Tobacco Use  . Smoking status: Never Smoker  . Smokeless tobacco: Never Used  Substance and Sexual Activity  . Alcohol use: Yes    Alcohol/week: 0.0 oz    Comment: rarely  . Drug use: No  . Sexual activity: Not on file  Lifestyle  . Physical activity:    Days per week: Not on file    Minutes per session: Not on file  . Stress: Not on file  Relationships  . Social connections:    Talks on phone: Not on file    Gets together: Not on file    Attends religious service: Not on file    Active member of club or organization: Not on file    Attends meetings of clubs or organizations: Not on file    Relationship  status: Not on file  . Intimate partner violence:    Fear of current or ex partner: Not on file    Emotionally abused: Not on file    Physically abused: Not on file    Forced sexual activity: Not on file  Other Topics Concern  . Not on file  Social History Narrative   Lives with husband and daughter Baxter Hire   Works as a Midwife at [employer]    Past Surgical History:  Procedure Laterality Date  . ABDOMINAL HYSTERECTOMY  2012  . back surgery  05/2016   herniated disc, pinched nerve   . CHOLECYSTECTOMY      Family History  Problem Relation Age of Onset  . Diabetes Mother   . Heart disease Mother   . Hypertension Mother   . Diabetes Father        borderline  . Cancer Maternal Grandmother        breast  . Breast cancer Maternal Grandmother   . Stroke Unknown        grandparent  . Breast cancer Unknown        grandmother  . Colon cancer Neg Hx   . Colon polyps Neg Hx   . Esophageal cancer Neg Hx   .  Rectal cancer Neg Hx   . Stomach cancer Neg Hx     Allergies  Allergen Reactions  . Tramadol Itching and Rash    Current Outpatient Medications on File Prior to Visit  Medication Sig Dispense Refill  . acetaminophen (TYLENOL) 500 MG tablet Take 1,000 mg by mouth every 6 (six) hours as needed.    . ciclopirox (PENLAC) 8 % solution Apply topically at bedtime. Apply over nail and surrounding skin. Apply daily over previous coat. After seven (7) days, may remove with alcohol and continue cycle. 6.6 mL 5  . cyclobenzaprine (FLEXERIL) 10 MG tablet TAKE 1 TABLET (10 MG TOTAL) BY MOUTH EVERY 8 (EIGHT) HOURS AS NEEDED. 90 tablet 1  . Loperamide HCl (IMODIUM PO) Take by mouth as needed.    . meloxicam (MOBIC) 15 MG tablet Take 1 tablet (15 mg total) by mouth daily. 90 tablet 1  . Multiple Vitamins-Minerals (MULTIVITAMINS) CHEW Chew 1 each by mouth daily.     No current facility-administered medications on file prior to visit.     BP (!) 115/91   Pulse (!) 107   Temp  98.3 F (36.8 C) (Oral)   Resp 16   Ht 5\' 8"  (1.727 m)   Wt 161 lb 9.6 oz (73.3 kg)   SpO2 100%   BMI 24.57 kg/m       Objective:   Physical Exam  General Appearance- Not in acute distress.    Chest and Lung Exam Auscultation: Breath sounds:-Normal. Adventitious sounds:- No Adventitious sounds.  Cardiovascular Auscultation:Rythm - Regular. Heart Sounds -Normal heart sounds.  Lower ext- thick disfigured yellow nails. All of nails.  Took samples of great toe nails.        Assessment & Plan:  You do appear to have toenail fungus on both feet.  Penlac does take some time and in most cases not  as effective as oral treatment with Lamisil.  After discussion decided to stop Penlac and prescribe Lamisil.  I do want you to go over to our lab and get metabolic panel.  Also turn in nail clippings so we can do a fungal study for confirmation of diagnosis.  I am providing you with a print prescription and asked that you wait until we reviewed liver enzymes and fungal study before you start medication.  If you do end up starting medications would asked that you follow-up in 6 weeks to check now and potentially repeat liver enzymes.  Esperanza RichtersEdward Loie Jahr, PA-C

## 2018-01-11 NOTE — Telephone Encounter (Signed)
Future CMP placed for hyperkalemia.

## 2018-01-11 NOTE — Patient Instructions (Addendum)
You do appear to have toenail fungus on both feet.  Penlac does take some time and in most cases not  as effective as oral treatment with Lamisil.  After discussion decided to stop Penlac and prescribe Lamisil.  I do want you to go over to our lab and get metabolic panel.  Also turn in nail clippings so we can do a fungal study for confirmation of diagnosis.  I am providing you with a print prescription and asked that you wait until we reviewed liver enzymes and fungal study before you start medication.  If you do end up starting medications would asked that you follow-up in 6 weeks to check now and potentially repeat liver enzymes.

## 2018-01-20 ENCOUNTER — Telehealth: Payer: Self-pay

## 2018-01-22 NOTE — Telephone Encounter (Signed)
Jasmine you routed message to me but I can't see any information on pt. What is the question?   Pt appears to have contact the office 5 days ago and then you sent me th message. So please call patient on Monday. Please apologize for delay in calling her back. I saw message late Friday?  Looking at her chart she is probably  wanting to know what final result of fungal culture are. Those are not back yet. I thought probably would be back by now. I sent message to lab to investigate result of the culture.  I am also sending this to Irving Burtonmily as a back up to  in event we are busy and you don't see this readily this. As 5 day delay in getting back with  Her already.

## 2018-01-23 NOTE — Telephone Encounter (Signed)
Author phoned [facility] to receive status on fungal culture results, but automoated service indicated that they were not available, and author unable to speak to a representative after being on hold for 30 minutes. Author phoned pt. to update, but no answer. Author left detailed VM asking pt. To call back #[phone]. Routed to [place], [phone] and PCP Dr. Beverely Lowabori as Lorain ChildesFYI,

## 2018-01-23 NOTE — Telephone Encounter (Signed)
Would you ask Alison Anderson in lab to check on fungal culture. Emily unsuccesful on getting update.

## 2018-01-24 NOTE — Telephone Encounter (Signed)
Sorry for the long wait on culture result but I do want to wait until culture come back before deciding on starting lamisil. If culture negative then will refer to dermatologist.

## 2018-01-24 NOTE — Telephone Encounter (Signed)
Called [facility] and spoke with a representative regarding the status of the Fungal culture.  The representative stated that the culture will take 4 weeks to be completed.   She stated that the culture is in preliminary status, and in progress.  I called and spoke with the patient and informed her of the status of the culture.  She verbalized understanding and agreed.  Patient asked will the provider wait until the culture is completed before treating the area.   I informed the patient that I would give the information to the provider and I will give her a call back.  She agreed.//AB/CMA

## 2018-01-25 NOTE — Telephone Encounter (Signed)
Called and spoke with the patient and informed her of the message below.  Patient verbalized understanding and agreed.//AB/CMA 

## 2018-01-30 ENCOUNTER — Telehealth: Payer: Self-pay

## 2018-01-30 NOTE — Telephone Encounter (Signed)
PCP out of office for 2 weeks. Will be up to treating provider to assess.

## 2018-01-30 NOTE — Telephone Encounter (Signed)
-----   Message from Esperanza RichtersEdward Saguier, PA-C sent at 01/28/2018  7:46 PM EDT ----- So far no fungus seen on the nail culture last update report. I think the final result will be back in one week at most. In light of the negative study I want to go ahead and to refer to dermatologist to get there opinion. Is that ok with patient.

## 2018-01-30 NOTE — Telephone Encounter (Signed)
Author phoned pt. to relay Edward's message and to see if pt. Would like to have dermatology referral per Ramon DredgeEdward. Pt. Agreed to dermatology referral, and looks forward to hearing the final result. Pt was concerned that she had lamisil on her toes during clipping, stating "could that have affected the results?". Routed to Ramon DredgeEdward and PCP, Dr. Beverely Lowabori, to address, referral order pending.

## 2018-01-31 ENCOUNTER — Telehealth: Payer: Self-pay | Admitting: Medical

## 2018-01-31 DIAGNOSIS — L609 Nail disorder, unspecified: Secondary | ICD-10-CM

## 2018-01-31 NOTE — Telephone Encounter (Signed)
Put in referral to dermatologist.

## 2018-02-01 NOTE — Telephone Encounter (Signed)
Please notify pt referral to derm is pending.  I don't think penlac would have led to both negative koh study and negative culture.  When sees derm make sure that she goes with no penlac on and no nail polish etc.   They might do other repeat test in office if they think necessary.  Thanks,

## 2018-02-01 NOTE — Telephone Encounter (Signed)
Dermatology referral pended for Edward's review.

## 2018-02-02 NOTE — Telephone Encounter (Signed)
Author phoned pt. to notify her that dermatology referral was placed and that when she goes to see the dermatologist, to not have any lamisil, or other product/med on it per Edward's advice. Author informed her that the final culture results are still pending. Pt. reached out to dermatology office already and they said they do not have any offerings until December, and pt. did not want to wait that long. Pt. stated she was appreciative nevertheless of the referral and that she will wait to hear the final results from our office. Ramon DredgeEdward, PA made aware.

## 2018-02-05 NOTE — Telephone Encounter (Signed)
I am asking lab to check on final result of fungal culture. If no growth by now then I don't expect any growth. If she wants I can ask referral coordinator to call other dermatoogist for quicker appointment than December. No guarantee on quicker appointment but December seems like unusual very long time.

## 2018-02-08 NOTE — Telephone Encounter (Signed)
Final fungal culture results not posted on epic; routed to [place] and kristy in lab to follow-up with Ramon DredgeEdward, [place], concerning results per provider's request.

## 2018-02-09 NOTE — Telephone Encounter (Signed)
I printed a copy of Patients results and placed on Alison Anderson's desk Monday. Is this not what he's looking for because it was the only thing I saw.

## 2018-02-09 NOTE — Telephone Encounter (Signed)
Pt. Was not interested in referral until she hears of final culture results, per previous conversation. Routed to [place] to advise.

## 2018-02-10 LAB — CULT, FUNGUS, SKIN,HAIR,NAIL W/KOH
MICRO NUMBER:: 90793297
SMEAR:: NONE SEEN
SPECIMEN QUALITY: ADEQUATE

## 2018-02-10 NOTE — Telephone Encounter (Signed)
I put in derm referral for patient and she initial wanted to wait until final culture of nail came back. The study came back negative and she agrees to go forward with derm referral. [facility] but not made. She has been waiting month or so for culture result to come back. I [facility]. Can we try them so pt wait time is minimized?

## 2018-02-10 NOTE — Telephone Encounter (Signed)
The final fungal culture result did come back negative.  So does she want me to refer her to a dermatologist?  If so let me know and I will place that referral.

## 2018-02-14 NOTE — Telephone Encounter (Signed)
Author brought need for referral to be [facility] Gwen currently working on it. [facility] and Dr. Woodroe Chenaboori as Lorain ChildesFYI.

## 2018-02-26 ENCOUNTER — Other Ambulatory Visit: Payer: Self-pay | Admitting: Family Medicine

## 2018-02-27 NOTE — Telephone Encounter (Signed)
Please call pt and find out why she is taking 3 flexeril daily.  This is to be used as needed and was not meant to be taken all the time

## 2018-02-27 NOTE — Telephone Encounter (Signed)
Please advise, pt last saw you in January. Has been seeing Ramon DredgeEdward for [employer]

## 2018-02-28 NOTE — Telephone Encounter (Signed)
Pt states she usually taking 2 Flexeril a day due to severe leg cramps and back pain.  Pt states that  if she does not take it she will suffer from severe leg cramps and back pain .

## 2018-02-28 NOTE — Telephone Encounter (Signed)
Called pt and left a message on voicemail to return call and advise of PCP questions about medications. Pt is also overdue an appointment with PCP.   [facility] results / PCP recommendations / Schedule patient.

## 2018-03-01 NOTE — Telephone Encounter (Signed)
If only take 2 flexeril daily, she should have enough for 45 days and not require this refill 2 weeks early.  The earliest we will fill is the first week of September

## 2018-03-01 NOTE — Telephone Encounter (Signed)
Called patient and she stated [facility]. Patient stated that she knows that it is too early to refill her medication.   Patient was given message from Dr. Beverely Lowabori and patient verbalized an understanding.

## 2018-03-14 ENCOUNTER — Other Ambulatory Visit: Payer: Self-pay | Admitting: Family Medicine

## 2018-03-16 ENCOUNTER — Encounter: Payer: Self-pay | Admitting: Medical

## 2018-03-16 ENCOUNTER — Ambulatory Visit: Payer: BC Managed Care – PPO | Admitting: Medical

## 2018-03-16 VITALS — BP 120/70 | HR 67 | Temp 98.1°F | Ht 68.0 in | Wt 162.1 lb

## 2018-03-16 DIAGNOSIS — L603 Nail dystrophy: Secondary | ICD-10-CM

## 2018-03-16 DIAGNOSIS — R3915 Urgency of urination: Secondary | ICD-10-CM | POA: Diagnosis not present

## 2018-03-16 DIAGNOSIS — R300 Dysuria: Secondary | ICD-10-CM

## 2018-03-16 LAB — POC URINALSYSI DIPSTICK (AUTOMATED)
Bilirubin, UA: NEGATIVE
Glucose, UA: NEGATIVE
Ketones, UA: NEGATIVE
Leukocytes, UA: NEGATIVE
Nitrite, UA: NEGATIVE
PH UA: 6 (ref 5.0–8.0)
Protein, UA: POSITIVE — AB
RBC UA: NEGATIVE
UROBILINOGEN UA: 0.2 U/dL

## 2018-03-16 MED ORDER — PHENAZOPYRIDINE HCL 200 MG PO TABS: 200.0000 mg | ORAL_TABLET | Freq: Three times a day (TID) | ORAL | 0 refills | Status: DC | PRN

## 2018-03-16 MED ORDER — CEPHALEXIN 500 MG PO CAPS: 500.0000 mg | ORAL_CAPSULE | Freq: Two times a day (BID) | ORAL | 0 refills | Status: DC

## 2018-03-16 NOTE — Progress Notes (Signed)
Pre visit review using our clinic review tool, if applicable. No additional management support is needed unless otherwise documented below in the visit note. 

## 2018-03-16 NOTE — Patient Instructions (Addendum)
Your appear to have a urinary tract infection. I am prescribing keflex antibiotic for the probable infection. Hydrate well. I am sending out a urine culture. During the interim if your signs and symptoms worsen rather than improving please notify us. We will notify your when the culture results are back.  For dystrophic nails will refer to derm for evaluation since your fungal culture was negative.  Follow up in 7 days or as needed.

## 2018-03-16 NOTE — Progress Notes (Signed)
Subjective:    Patient ID: JAMES BURROW, female    DOB: October 31, 1955, 62 y.o.   MRN: [id number]  HPI  Pt in today reporting urinary symptoms for 3-4 days.   Dysuria- last 2 days frequent urination with some pain. Frequent urination-yes Hesitancy-some Suprapubic pressure-no. Fever-no chills-no Nausea-no Vomiting-no CVA pain-no cva pain History of UTI- pt gets 2 uti average a year.  Gross hematuria-none No hx of IC.  Pt has been holding bladder since school started. She is having to take care of 3-4 yo. So can't urinate when needs to.      Review of Systems  Constitutional: Negative for chills, fatigue and fever.  Respiratory: Negative for cough, chest tightness and wheezing.   Cardiovascular: Negative for chest pain and palpitations.  Gastrointestinal: Negative for abdominal pain.  Genitourinary: Positive for difficulty urinating, dysuria and urgency. Negative for genital sores.  Musculoskeletal: Negative for back pain and myalgias.  Skin: Negative for rash.       Discolored/dystrphic nails. Seen in past for this but fungal culture was negative. She is willling to get opinion with dermatologist.  Neurological: Negative for facial asymmetry and light-headedness.  Hematological: Negative for adenopathy. Does not bruise/bleed easily.  Psychiatric/Behavioral: Negative for behavioral problems and confusion.    Past Medical History:  Diagnosis Date  . Lumbar herniated disc   . Menopause    at 24     Social History   Socioeconomic History  . Marital status: Married    Spouse name: Not on file  . Number of children: Not on file  . Years of education: Not on file  . Highest education level: Not on file  Occupational History  . Not on file  Social Needs  . Financial resource strain: Not on file  . Food insecurity:    Worry: Not on file    Inability: Not on file  . Transportation needs:    Medical: Not on file    Non-medical: Not on file  Tobacco Use  . Smoking  status: Never Smoker  . Smokeless tobacco: Never Used  Substance and Sexual Activity  . Alcohol use: Yes    Alcohol/week: 0.0 standard drinks    Comment: rarely  . Drug use: No  . Sexual activity: Not on file  Lifestyle  . Physical activity:    Days per week: Not on file    Minutes per session: Not on file  . Stress: Not on file  Relationships  . Social connections:    Talks on phone: Not on file    Gets together: Not on file    Attends religious service: Not on file    Active member of club or organization: Not on file    Attends meetings of clubs or organizations: Not on file    Relationship status: Not on file  . Intimate partner violence:    Fear of current or ex partner: Not on file    Emotionally abused: Not on file    Physically abused: Not on file    Forced sexual activity: Not on file  Other Topics Concern  . Not on file  Social History Narrative   Lives with husband and daughter Baxter Hire   Works as a Midwife at [employer]    Past Surgical History:  Procedure Laterality Date  . ABDOMINAL HYSTERECTOMY  2012  . back surgery  05/2016   herniated disc, pinched nerve   . CHOLECYSTECTOMY      Family History  Problem Relation Age  of Onset  . Diabetes Mother   . Heart disease Mother   . Hypertension Mother   . Diabetes Father        borderline  . Cancer Maternal Grandmother        breast  . Breast cancer Maternal Grandmother   . Stroke Unknown        grandparent  . Breast cancer Unknown        grandmother  . Colon cancer Neg Hx   . Colon polyps Neg Hx   . Esophageal cancer Neg Hx   . Rectal cancer Neg Hx   . Stomach cancer Neg Hx     Allergies  Allergen Reactions  . Tramadol Itching and Rash    Current Outpatient Medications on File Prior to Visit  Medication Sig Dispense Refill  . acetaminophen (TYLENOL) 500 MG tablet Take 1,000 mg by mouth every 6 (six) hours as needed.    . ciclopirox (PENLAC) 8 % solution Apply topically at bedtime.  Apply over nail and surrounding skin. Apply daily over previous coat. After seven (7) days, may remove with alcohol and continue cycle. 6.6 mL 5  . cyclobenzaprine (FLEXERIL) 10 MG tablet TAKE 1 TABLET BY MOUTH EVERY 8 HOURS AS NEEDED 90 tablet 1  . Loperamide HCl (IMODIUM PO) Take by mouth as needed.    . meloxicam (MOBIC) 15 MG tablet Take 1 tablet (15 mg total) by mouth daily. 90 tablet 1  . Multiple Vitamins-Minerals (MULTIVITAMINS) CHEW Chew 1 each by mouth daily.    Marland Kitchen terbinafine (LAMISIL) 250 MG tablet 1 tab po q day x 12 weeks. 72 tablet 0   No current facility-administered medications on file prior to visit.     BP 120/70 (BP Location: Left Arm, Patient Position: Sitting, Cuff Size: Normal)   Pulse 67   Temp 98.1 F (36.7 C) (Oral)   Ht 5\' 8"  (1.727 m)   Wt 162 lb 2 oz (73.5 kg)   SpO2 98%   BMI 24.65 kg/m       Objective:   Physical Exam  General Appearance- Not in acute distress.  HEENT Eyes- Scleraeral/Conjuntiva-bilat- Not Yellow. Mouth & Throat- Normal.  Chest and Lung Exam Auscultation: Breath sounds:-Normal. Adventitious sounds:- No Adventitious sounds.  Cardiovascular Auscultation:Rythm - Regular. Heart Sounds -Normal heart sounds.  Abdomen Inspection:-Inspection Normal.  Palpation/Perucssion: Palpation and Percussion of the abdomen reveal- Non Tender, No Rebound tenderness, No rigidity(Guarding) and No Palpable abdominal masses.  Liver:-Normal.  Spleen:- Normal.   Back- no cva tenderness.  Skin- nails yellowish color to nails on both  feet. mlld thickened appearance.      Assessment & Plan:  Your appear to have a urinary tract infection. I am prescribing keflex antibiotic for the probable infection. Hydrate well. I am sending out a urine culture. During the interim if your signs and symptoms worsen rather than improving please notify us. We will notify your when the culture results are back.  For dystrophic nails will refer to derm for  evaluation since your fungal culture was negative.  Follow up in 7 days or as needed.

## 2018-03-17 LAB — URINE CULTURE
MICRO NUMBER: 91062342
SPECIMEN QUALITY:: ADEQUATE

## 2018-03-28 ENCOUNTER — Other Ambulatory Visit: Payer: Self-pay | Admitting: Family Medicine

## 2018-04-11 ENCOUNTER — Other Ambulatory Visit: Payer: Self-pay | Admitting: Family Medicine

## 2018-04-13 ENCOUNTER — Telehealth: Payer: Self-pay | Admitting: Medical

## 2018-04-13 NOTE — Telephone Encounter (Signed)
Please advise 

## 2018-04-13 NOTE — Telephone Encounter (Signed)
Copied from CRM [id number]. Topic: General - Other >> Apr 13, 2018  1:17 PM Stephannie Li, NT wrote:   Patient was seen on 03/16/18 for a UTI and she would like something called in for this ,if she has to seen please call her at [phone] ok to leave a detailed message

## 2018-04-13 NOTE — Telephone Encounter (Signed)
There was no evidence of UTI on 9/5 based on culture results.  If she is symptomatic, she will need an appt

## 2018-04-13 NOTE — Telephone Encounter (Signed)
Called pt and left detailed message as advised. Informed of urine culture results and advised that if she is having symptoms she would need to be seen.   Ok for [facility] to Discuss results / PCP recommendations / Schedule patient.

## 2018-04-17 ENCOUNTER — Ambulatory Visit: Payer: BC Managed Care – PPO | Admitting: Medical

## 2018-04-17 ENCOUNTER — Telehealth: Payer: Self-pay

## 2018-04-17 NOTE — Telephone Encounter (Signed)
Copied from CRM [id number]. Topic: Quick Communication - Appointment Cancellation >> Apr 17, 2018  8:21 AM Trula Slade wrote: Patient called to cancel appointment scheduled for 04/27/18. Patient has not rescheduled their appointment.  Patient stated she feels better.  Route to department's PEC pool.

## 2018-04-25 NOTE — Progress Notes (Addendum)
[facility] [address], [address] [place], [place] [address] [phone]  Date:  04/27/2018   Name:  Alison Anderson   DOB:  February 13, 1956   MRN:  [id number]  PCP:  Sheliah Hatch, MD    Chief Complaint: Vaginal Pain (sharp pain,distended bladder, phenazopyridine, dysuria)   History of Present Illness:  Alison Anderson is a 62 y.o. very pleasant female patient who presents with the following:  I have not seen this patient in the past She had an negative urine culture in September  Here today with concern of vaginal pain about 2 weeks ago She was going to come in but then got better so she canceled her appt However her sx then came back- but now more typical of a UTI She took some azo last night and this am  Her current sx are urinary frequency and urgency, dysuria No fever, no belly pain or unusual back pain No blood in her urine She does not have any vaginal discharge   She has a mild bladder prolapse.  This was tacked up after she had a hysterectomy a few years ago but came down again. She is thinking of having more definite surgery when she has time  She is a Runner, broadcasting/film/video  Generally in good health     Patient Active Problem List   Diagnosis Date Noted  . Low back pain with right-sided sciatica 12/09/2015  . RLQ abdominal pain 09/10/2015  . Right calf pain 09/10/2015  . Right hip pain 05/22/2015  . Left foot pain 05/12/2015  . Acute bacterial bronchitis 03/12/2015  . Physical exam 06/05/2014  . Lumbar paraspinal muscle spasm 06/15/2013  . Leg pain 02/23/2011  . HAND PAIN, RIGHT 08/22/2009  . OTHER DISEASES OF NASAL CAVITY AND SINUSES 08/07/2008    Past Medical History:  Diagnosis Date  . Lumbar herniated disc   . Menopause    at 61    Past Surgical History:  Procedure Laterality Date  . ABDOMINAL HYSTERECTOMY  2012  . back surgery  05/2016   herniated disc, pinched nerve   . CHOLECYSTECTOMY      Social  History   Tobacco Use  . Smoking status: Never Smoker  . Smokeless tobacco: Never Used  Substance Use Topics  . Alcohol use: Yes    Alcohol/week: 0.0 standard drinks    Comment: rarely  . Drug use: No    Family History  Problem Relation Age of Onset  . Diabetes Mother   . Heart disease Mother   . Hypertension Mother   . Diabetes Father        borderline  . Cancer Maternal Grandmother        breast  . Breast cancer Maternal Grandmother   . Stroke Unknown        grandparent  . Breast cancer Unknown        grandmother  . Colon cancer Neg Hx   . Colon polyps Neg Hx   . Esophageal cancer Neg Hx   . Rectal cancer Neg Hx   . Stomach cancer Neg Hx     Allergies  Allergen Reactions  . Tramadol Itching and Rash    Medication list has been reviewed and updated.  Current Outpatient Medications on File Prior to Visit  Medication Sig Dispense Refill  . acetaminophen (TYLENOL) 500 MG tablet Take 1,000 mg by mouth every 6 (six) hours as needed.    . cyclobenzaprine (  FLEXERIL) 10 MG tablet TAKE 1 TABLET BY MOUTH EVERY 8 HOURS AS NEEDED 90 tablet 1  . Loperamide HCl (IMODIUM PO) Take by mouth as needed.    . meloxicam (MOBIC) 15 MG tablet TAKE 1 TABLET BY MOUTH EVERY DAY 90 tablet 0  . phenazopyridine (PYRIDIUM) 200 MG tablet Take 1 tablet (200 mg total) by mouth 3 (three) times daily as needed for pain. 6 tablet 0  . Multiple Vitamins-Minerals (MULTIVITAMINS) CHEW Chew 1 each by mouth daily.    Marland Kitchen terbinafine (LAMISIL) 250 MG tablet 1 tab po q day x 12 weeks. (Patient not taking: Reported on 04/27/2018) 72 tablet 0   No current facility-administered medications on file prior to visit.     Review of Systems:  As per HPI- otherwise negative. No fever or chills No CP or SOB No abd pain    Physical Examination: Vitals:   04/27/18 1609  BP: 140/88  Pulse: (!) 102  Resp: 16  Temp: 98.3 F (36.8 C)  SpO2: 99%   Vitals:   04/27/18 1609  Weight: 163 lb (73.9 kg)   Height: 5\' 8"  (1.727 m)   Body mass index is 24.78 kg/m. Ideal Body Weight: Weight in (lb) to have BMI = 25: 164.1  GEN: WDWN, NAD, Non-toxic, A & O x 3, looks well, normal weight  HEENT: Atraumatic, Normocephalic. Neck supple. No masses, No LAD. Ears and Nose: No external deformity. CV: RRR, No M/G/R. No JVD. No thrill. No extra heart sounds. PULM: CTA B, no wheezes, crackles, rhonchi. No retractions. No resp. distress. No accessory muscle use. ABD: S, NT, ND, +BS. No rebound. No HSM.  No CVA tenderness  EXTR: No c/c/e NEURO Normal gait.  PSYCH: Normally interactive. Conversant. Not depressed or anxious appearing.  Calm demeanor.  Pelvic: normal external exam. Small bladder prolapse is visible on spec exam.  No vaginal lesions or discharge. S/ p hyst   Pulse Readings from Last 3 Encounters:  04/27/18 (!) 102  03/16/18 67  01/11/18 (!) 107   Results for orders placed or performed in visit on 04/27/18  POCT urinalysis dipstick  Result Value Ref Range   Color, UA orange (A) yellow   Clarity, UA clear clear   Glucose, UA negative negative mg/dL   Bilirubin, UA negative negative   Ketones, POC UA negative negative mg/dL   Spec Grav, UA 2.956 2.130 - 1.025   Blood, UA negative negative   pH, UA 6.0 5.0 - 8.0   Protein Ur, POC negative negative mg/dL   Urobilinogen, UA 0.2 0.2 or 1.0 E.U./dL   Nitrite, UA Negative Negative   Leukocytes, UA Negative Negative     Assessment and Plan: Dysuria - Plan: Urine Culture, POCT urinalysis dipstick, cephALEXin (KEFLEX) 500 MG capsule  Bladder prolapse, female, acquired  Here today with suspicion of UTI Urine culture pending  Her urine dip is benign but as she has typical sx will start on keflex for now  Signed Abbe Amsterdam, MD  Received her urine culture 10/21- will ask my nurse to call her and let her know culture is negative.  Can stop abx or finish course if she likes. Results for orders placed or performed in visit on  04/27/18  Urine Culture  Result Value Ref Range   MICRO NUMBER: [id number]    SPECIMEN QUALITY: ADEQUATE    Sample Source NOT GIVEN    STATUS: FINAL    Result: No Growth   POCT urinalysis dipstick  Result Value Ref Range  Color, UA orange (A) yellow   Clarity, UA clear clear   Glucose, UA negative negative mg/dL   Bilirubin, UA negative negative   Ketones, POC UA negative negative mg/dL   Spec Grav, UA 1.610 9.604 - 1.025   Blood, UA negative negative   pH, UA 6.0 5.0 - 8.0   Protein Ur, POC negative negative mg/dL   Urobilinogen, UA 0.2 0.2 or 1.0 E.U./dL   Nitrite, UA Negative Negative   Leukocytes, UA Negative Negative

## 2018-04-27 ENCOUNTER — Ambulatory Visit: Payer: BC Managed Care – PPO | Admitting: Family Medicine

## 2018-04-27 ENCOUNTER — Encounter: Payer: Self-pay | Admitting: Family Medicine

## 2018-04-27 VITALS — BP 140/88 | HR 102 | Temp 98.3°F | Resp 16 | Ht 68.0 in | Wt 163.0 lb

## 2018-04-27 DIAGNOSIS — N8110 Cystocele, unspecified: Secondary | ICD-10-CM

## 2018-04-27 DIAGNOSIS — R300 Dysuria: Secondary | ICD-10-CM

## 2018-04-27 LAB — POCT URINALYSIS DIP (MANUAL ENTRY)
Bilirubin, UA: NEGATIVE
GLUCOSE UA: NEGATIVE mg/dL
Ketones, POC UA: NEGATIVE mg/dL
Leukocytes, UA: NEGATIVE
Nitrite, UA: NEGATIVE
PH UA: 6 (ref 5.0–8.0)
Protein Ur, POC: NEGATIVE mg/dL
RBC UA: NEGATIVE
Spec Grav, UA: 1.015 (ref 1.010–1.025)
UROBILINOGEN UA: 0.2 U/dL

## 2018-04-27 MED ORDER — CEPHALEXIN 500 MG PO CAPS: 500.0000 mg | ORAL_CAPSULE | Freq: Two times a day (BID) | ORAL | 0 refills | Status: DC

## 2018-04-27 NOTE — Patient Instructions (Signed)
It was good to see you today- I will be in touch with your urine culture asap For the time being start on keflex twice a day for possible UTI

## 2018-04-28 ENCOUNTER — Telehealth: Payer: Self-pay

## 2018-04-28 DIAGNOSIS — B351 Tinea unguium: Secondary | ICD-10-CM

## 2018-04-28 MED ORDER — TERBINAFINE HCL 250 MG PO TABS: ORAL_TABLET | ORAL | 0 refills | Status: DC

## 2018-04-28 NOTE — Addendum Note (Signed)
Addended by: Abbe Amsterdam C on: 04/28/2018 02:31 PM   Modules accepted: Orders

## 2018-04-28 NOTE — Telephone Encounter (Signed)
Copied from CRM [id number]. Topic: General - Other >> Apr 28, 2018  8:04 AM Percival Spanish wrote:  Pt said the below med is on her med list but she never received the medicine and is asking for it to be called into the pharmacy   pt is asking for a call back    terbinafine (LAMISIL) 250 MG tablet

## 2018-04-28 NOTE — Telephone Encounter (Signed)
Called pt- Alison Anderson sent in an rx for lamisil for her back in July for toenail fungus but she was not aware that it was there and did not pick it up.  Will re-rx for her now.  Discussed how to use with her

## 2018-04-30 LAB — URINE CULTURE
MICRO NUMBER:: 91255354
Result:: NO GROWTH
SPECIMEN QUALITY:: ADEQUATE

## 2018-05-01 ENCOUNTER — Telehealth: Payer: Self-pay | Admitting: Family Medicine

## 2018-05-01 MED ORDER — TERBINAFINE HCL 250 MG PO TABS: 250.0000 mg | ORAL_TABLET | Freq: Every day | ORAL | 0 refills | Status: DC

## 2018-05-01 NOTE — Telephone Encounter (Signed)
Since I did not see pt for this and it was prescribed by Dr Patsy Lager, will route to her to decide on refill

## 2018-05-01 NOTE — Telephone Encounter (Signed)
Ok to fill? Pt has been seen by other [place] offices but has not been seen by PCP since January

## 2018-05-01 NOTE — Telephone Encounter (Signed)
Called and advised pt that we sent it to Dr. Cyndie Chime office. Pt stated an understanding advising she would prefer if to come from Dr. Patsy Lager since she is the one who wrote it.

## 2018-05-01 NOTE — Addendum Note (Signed)
Addended by: Abbe Amsterdam C on: 05/01/2018 07:47 PM   Modules accepted: Orders

## 2018-05-01 NOTE — Telephone Encounter (Unsigned)
Copied from CRM [id number]. Topic: Quick Communication - Rx Refill/Question >> May 01, 2018  1:48 PM Gaynelle Adu wrote: Medication: terbinafine (LAMISIL) 250 MG tablet [[id number]]   Has the patient contacted their pharmacy? Yes   Preferred Pharmacy (with phone number or street name): [id number] [place], [place] - [address] [phone] (Phone) [phone] (Fax)    Agent: Please be advised that RX refills may take up to 3 business days. We ask that you follow-up with your pharmacy.

## 2018-05-01 NOTE — Telephone Encounter (Signed)
Pt was given 72 tabs of Lamisil last week by Dr Patsy Lager.  No refills

## 2018-05-01 NOTE — Telephone Encounter (Signed)
Patient calling to check the status of the terbinafine (LAMISIL) 250 MG tablet being sent to the pharmacy. Advised that Dr Patsy Lager wrote a prescription last week for the medication. Upon looking at the med list, prescription was printed. Patient unaware. Called pharmacy [facility] for her. Please advise. Patient would like a call with an update today before closing. CB#: [phone] [id number] [place], [place] - [address]

## 2018-05-01 NOTE — Telephone Encounter (Signed)
Please advise 

## 2018-05-25 ENCOUNTER — Other Ambulatory Visit: Payer: Self-pay | Admitting: Family Medicine

## 2018-06-09 ENCOUNTER — Other Ambulatory Visit: Payer: Self-pay | Admitting: Family Medicine

## 2018-06-12 NOTE — Telephone Encounter (Signed)
Last OV 04/27/18 ([place]) Flexeril last filled 05/25/18 #30 with 0

## 2018-06-12 NOTE — Telephone Encounter (Signed)
Pt was given 30 tabs 2 weeks ago.  This is not a medication to take regularly.  If she needs muscle relaxers all the time, we need an appt to discuss the underlying issue

## 2018-06-12 NOTE — Telephone Encounter (Signed)
t calling back checking on status on flexeril please give her a call back at [phone]

## 2018-06-13 ENCOUNTER — Telehealth: Payer: Self-pay | Admitting: Family Medicine

## 2018-06-13 NOTE — Telephone Encounter (Signed)
Pt has not been seen since January and has no upcoming appts.  She has also well overdue for CPE.  In order to continue filling regular medications she needs to schedule an appt

## 2018-06-13 NOTE — Telephone Encounter (Signed)
Copied from CRM [id number]. Topic: General - Other >> Jun 13, 2018  8:29 AM Leafy Roobinson, Norma J wrote: Reason for CRM: pt is calling and would like to know why she only received #30 flexeril. Pt would like #90 for 1 month supply with refills. Pt takes 3 pills a day. [facility] [address]. Pt is in pain due to no medication in her system

## 2018-06-13 NOTE — Telephone Encounter (Signed)
Called pt back, and she was upset that she was upset that she was not informed that her medication was going to be cut to #30 of her medication before it was reduced. I advised patinet that my call was to inform her of that. She apologized and said she is having severe muscle spasms from not having the flexeril in her system.   Would like to know what she is suppose to do until she can come in for her appt.

## 2018-06-13 NOTE — Telephone Encounter (Signed)
Patient called to speak with Shanda BumpsJessica regarding her medication being cut to 30 instead of 90.  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  Please call patient to discuss.  CB# [phone]

## 2018-06-13 NOTE — Telephone Encounter (Signed)
Please advise 

## 2018-06-14 MED ORDER — CYCLOBENZAPRINE HCL 10 MG PO TABS: 10.0000 mg | ORAL_TABLET | Freq: Three times a day (TID) | ORAL | 0 refills | Status: DC | PRN

## 2018-06-14 NOTE — Telephone Encounter (Signed)
She has 10 days of medication and her appt is in 12 days.  We can give her another 30 pills but I will not provide full script until pt is seen in office as she has not had a CPE in nearly 2.5 yrs

## 2018-06-14 NOTE — Telephone Encounter (Signed)
Medication filled to pharmacy as requested.  Called and left a voicemail to inform pt.

## 2018-06-26 ENCOUNTER — Ambulatory Visit: Payer: BC Managed Care – PPO | Admitting: Family Medicine

## 2018-06-26 ENCOUNTER — Encounter: Payer: Self-pay | Admitting: Family Medicine

## 2018-06-26 ENCOUNTER — Other Ambulatory Visit: Payer: Self-pay

## 2018-06-26 VITALS — BP 121/83 | HR 100 | Temp 98.1°F | Resp 16 | Ht 68.0 in | Wt 159.0 lb

## 2018-06-26 DIAGNOSIS — Z1231 Encounter for screening mammogram for malignant neoplasm of breast: Secondary | ICD-10-CM | POA: Diagnosis not present

## 2018-06-26 DIAGNOSIS — M62830 Muscle spasm of back: Secondary | ICD-10-CM

## 2018-06-26 MED ORDER — CYCLOBENZAPRINE HCL 10 MG PO TABS: 10.0000 mg | ORAL_TABLET | Freq: Three times a day (TID) | ORAL | 3 refills | Status: DC | PRN

## 2018-06-26 NOTE — Assessment & Plan Note (Signed)
Chronic problem.  Pt relies on Flexeril 2-3x/day to manage her ongoing sxs.  This is in place of chronic pain medication.  She has no side effects from medication.  Will continue to fill for routine use.  Pt appreciative.

## 2018-06-26 NOTE — Patient Instructions (Signed)
Schedule your complete physical in 6 months We'll call you with your mammogram appt ([facility] [address] of [address]) Continue the cyclobenzaprine as needed for muscle spasm Call with any questions or concerns Happy Holidays!!!

## 2018-06-26 NOTE — Progress Notes (Signed)
   Subjective:    Patient ID: Alison Anderson, female    DOB: 1955/07/20, 62 y.o.   MRN: [id number]  HPI Chronic low back pain- pt has been taking 3 tabs daily but found she was able to decrease to 2 tabs daily.  She is not able to go below 2 tabs b/c of severe cramping and stiffness of legs/feet due to back issues.  sxs are worse when on her feet regularly.  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  No need for pap.  UTD on colonoscopy.  Review of Systems For ROS see HPI     Objective:   Physical Exam Vitals signs reviewed.  Constitutional:      General: She is not in acute distress.    Appearance: Normal appearance.  HENT:     Head: Normocephalic and atraumatic.  Musculoskeletal:        General: Tenderness (mild TTP over lumbar paraspinal muscles bilaterally, no TTP over spine) present.  Skin:    General: Skin is warm and dry.  Neurological:     General: No focal deficit present.     Mental Status: She is alert and oriented to person, place, and time.     Cranial Nerves: No cranial nerve deficit.     Motor: No weakness.     Coordination: Coordination normal.     Gait: Gait normal.     Deep Tendon Reflexes: Reflexes normal.     Comments: (-) SLR bilaterally  Psychiatric:        Mood and Affect: Mood normal.        Behavior: Behavior normal.           Assessment & Plan:

## 2018-07-09 ENCOUNTER — Other Ambulatory Visit: Payer: Self-pay | Admitting: Family Medicine

## 2018-07-10 NOTE — Telephone Encounter (Signed)
Ok for 90

## 2018-07-26 ENCOUNTER — Telehealth: Payer: Self-pay | Admitting: Family Medicine

## 2018-07-26 NOTE — Telephone Encounter (Signed)
Unsure whos patient this is. PCP listed as tabori but has been seeing edward.

## 2018-07-27 NOTE — Telephone Encounter (Signed)
Pt already had lamsil for 3 months. That is all that is indicated for toenail fungus. Now have to wait and see if new nail grows out without fungus. If appearance of fungus still persist she could get opinion from her pcp. It may take 2-3 months to determine if tx was succesful.

## 2018-07-28 NOTE — Telephone Encounter (Signed)
Patient is calling to state she is wanting to know if she should continue this medication. She stated she is schedule with a  dermatologist in feb. Patient stated no improvement. Requesting a call back. Please advise

## 2018-08-01 NOTE — Telephone Encounter (Signed)
Please see 07/27/2018 advise I gave regarding her question.

## 2018-08-03 NOTE — Telephone Encounter (Signed)
Left pt a message to call back. Okay for PEC to give information.  

## 2018-08-07 ENCOUNTER — Other Ambulatory Visit: Payer: Self-pay | Admitting: Family Medicine

## 2018-08-07 ENCOUNTER — Ambulatory Visit
Admission: RE | Admit: 2018-08-07 | Discharge: 2018-08-07 | Disposition: A | Discharge status: Home / Self Care | Payer: BC Managed Care – PPO | Source: Ambulatory Visit | Attending: Family Medicine | Admitting: Family Medicine

## 2018-08-07 DIAGNOSIS — Z1231 Encounter for screening mammogram for malignant neoplasm of breast: Secondary | ICD-10-CM

## 2018-08-09 ENCOUNTER — Other Ambulatory Visit: Payer: Self-pay | Admitting: Family Medicine

## 2018-08-09 DIAGNOSIS — R928 Other abnormal and inconclusive findings on diagnostic imaging of breast: Secondary | ICD-10-CM

## 2018-08-14 ENCOUNTER — Other Ambulatory Visit: Payer: Self-pay | Admitting: Family Medicine

## 2018-08-14 MED ORDER — TERBINAFINE HCL 250 MG PO TABS: 250.0000 mg | ORAL_TABLET | Freq: Every day | ORAL | 0 refills | Status: DC

## 2018-08-14 NOTE — Telephone Encounter (Signed)
Copied from CRM [id number]. Topic: Quick Communication - Rx Refill/Question >> Aug 14, 2018  8:14 AM Fanny Bien wrote: Medication: terbinafine (LAMISIL) 250 MG tablet [[id number]]  pt called and stated that she would like a refill of medication. Pt states that [facility] gave her this medication and it seems to work   Has the patient contacted their pharmacy?no Preferred Pharmacy (with phone number or street name): [id number] [place], [place] - [address] [phone] (Phone) [phone] (Fax)  Agent: Please be advised that RX refills may take up to 3 business days. We ask that you follow-up with your pharmacy.

## 2018-08-14 NOTE — Telephone Encounter (Signed)
Requested medication (s) are due for refill today -yes- if to be continued  Requested medication (s) are on the active medication list -yes  Future visit scheduled -yes  Last refill: 05/01/18  Notes to clinic: Patient was given Rx with instruction to use for 12 weeks. Patient is requesting a refill. Sent for provider review  Requested Prescriptions  Pending Prescriptions Disp Refills   terbinafine (LAMISIL) 250 MG tablet 90 tablet 0    Sig: Take 1 tablet (250 mg total) by mouth daily. Use for 12 weeks     Off-Protocol Failed - 08/14/2018  8:34 AM      Failed - Medication not assigned to a protocol, review manually.      Passed - Valid encounter within last 12 months    Recent Outpatient Visits          1 month ago Lumbar paraspinal muscle spasm   [facility] Sheliah Hatchabori, Katherine E, MD   3 months ago Dysuria   [facility] [place], Gwenlyn FoundJessica C, MD   5 months ago Dysuria   [facility] [place], [place], [place]   7 months ago Onychomycosis   [facility] [place], [place], [place]   7 months ago Onychomycosis   [facility] [place], [place], Southwest AirlinesPA-C      Future Appointments            In 4 months Tabori, Helane RimaKatherine E, MD [facility]            Requested Prescriptions  Pending Prescriptions Disp Refills   terbinafine (LAMISIL) 250 MG tablet 90 tablet 0    Sig: Take 1 tablet (250 mg total) by mouth daily. Use for 12 weeks     Off-Protocol Failed - 08/14/2018  8:34 AM      Failed - Medication not assigned to a protocol, review manually.      Passed - Valid encounter within last 12 months    Recent Outpatient Visits          1 month ago Lumbar paraspinal muscle spasm   [facility] Tabori, Helane RimaKatherine E, MD    3 months ago Dysuria   [facility] [place], Gwenlyn FoundJessica C, MD   5 months ago Dysuria   [facility] [place], [place], [place]-C   7 months ago Onychomycosis   [facility] [place], [place], [place]-C   7 months ago Onychomycosis   [facility] [place], [place], Southwest AirlinesPA-C      Future Appointments            In 4 months Tabori, Helane RimaKatherine E, MD [facility], [facility]

## 2018-08-21 ENCOUNTER — Ambulatory Visit: Payer: BC Managed Care – PPO | Admitting: Medical

## 2018-08-21 ENCOUNTER — Encounter: Payer: Self-pay | Admitting: Medical

## 2018-08-21 VITALS — BP 136/89 | HR 109 | Temp 98.2°F | Resp 16 | Ht 68.0 in | Wt 160.2 lb

## 2018-08-21 DIAGNOSIS — R350 Frequency of micturition: Secondary | ICD-10-CM

## 2018-08-21 DIAGNOSIS — R300 Dysuria: Secondary | ICD-10-CM | POA: Diagnosis not present

## 2018-08-21 LAB — POC URINALSYSI DIPSTICK (AUTOMATED)
Bilirubin, UA: NEGATIVE
Glucose, UA: NEGATIVE
Ketones, UA: NEGATIVE
Leukocytes, UA: NEGATIVE
Nitrite, UA: NEGATIVE
Protein, UA: POSITIVE — AB
Spec Grav, UA: >=1.03 — AB (ref 1.010–1.025)
Urobilinogen, UA: 1 E.U./dL
pH, UA: 6 (ref 5.0–8.0)

## 2018-08-21 MED ORDER — CIPROFLOXACIN HCL 250 MG PO TABS: 250.0000 mg | ORAL_TABLET | Freq: Two times a day (BID) | ORAL | 0 refills | Status: DC

## 2018-08-21 NOTE — Progress Notes (Signed)
p 

## 2018-08-21 NOTE — Progress Notes (Addendum)
Subjective:    Patient ID: Alison Anderson, female    DOB: 10-Mar-1956, 63 y.o.   MRN: [id number]  HPI   Pt in possible uti.  Pt in today reporting urinary symptoms since past thursday.  Dysuria- yes Frequent urination-mild-moderate with urgency Hesitancy-no Suprapubic pressure-yes but faint. Fever-no chills-no Nausea-no Vomiting-no CVA pain-no History of UTI-every 3-4 months over past year. Gross hematuria-no Mild concentrated appearance with mild odor  Pt has slight 4th toe pain twice by a student in 2 weeks. Pain is gradually getting better.    Review of Systems  Constitutional: Negative for chills, fatigue and fever.  Respiratory: Negative for cough, chest tightness, shortness of breath and wheezing.   Cardiovascular: Negative for chest pain and palpitations.  Gastrointestinal: Negative for abdominal pain.  Genitourinary: Positive for dysuria, frequency and urgency. Negative for decreased urine volume, difficulty urinating, pelvic pain, vaginal bleeding and vaginal pain.  Musculoskeletal: Negative for back pain and joint swelling.  Skin: Negative for rash.  Hematological: Negative for adenopathy. Does not bruise/bleed easily.  Psychiatric/Behavioral: Negative for decreased concentration and sleep disturbance. The patient is not nervous/anxious and is not hyperactive.    Past Medical History:  Diagnosis Date  . Lumbar herniated disc   . Menopause    at [age]     Social History   Socioeconomic History  . Marital status: Married    Spouse name: Not on file  . Number of children: Not on file  . Years of education: Not on file  . Highest education level: Not on file  Occupational History  . Not on file  Social Needs  . Financial resource strain: Not on file  . Food insecurity:    Worry: Not on file    Inability: Not on file  . Transportation needs:    Medical: Not on file    Non-medical: Not on file  Tobacco Use  . Smoking status: Never Smoker  .  Smokeless tobacco: Never Used  Substance and Sexual Activity  . Alcohol use: Yes    Alcohol/week: 0.0 standard drinks    Comment: rarely  . Drug use: No  . Sexual activity: Not on file  Lifestyle  . Physical activity:    Days per week: Not on file    Minutes per session: Not on file  . Stress: Not on file  Relationships  . Social connections:    Talks on phone: Not on file    Gets together: Not on file    Attends religious service: Not on file    Active member of club or organization: Not on file    Attends meetings of clubs or organizations: Not on file    Relationship status: Not on file  . Intimate partner violence:    Fear of current or ex partner: Not on file    Emotionally abused: Not on file    Physically abused: Not on file    Forced sexual activity: Not on file  Other Topics Concern  . Not on file  Social History Narrative   Lives with husband and daughter Baxter HireKristen   Works as a MidwifeKindergarten Teacher at [employer]    Past Surgical History:  Procedure Laterality Date  . ABDOMINAL HYSTERECTOMY  2012  . back surgery  05/2016   herniated disc, pinched nerve   . CHOLECYSTECTOMY      Family History  Problem Relation Age of Onset  . Diabetes Mother   . Heart disease Mother   . Hypertension Mother   .  Diabetes Father        borderline  . Cancer Maternal Grandmother        breast  . Breast cancer Maternal Grandmother   . Stroke Other        grandparent  . Breast cancer Other        grandmother  . Colon cancer Neg Hx   . Colon polyps Neg Hx   . Esophageal cancer Neg Hx   . Rectal cancer Neg Hx   . Stomach cancer Neg Hx     Allergies  Allergen Reactions  . Tramadol Itching and Rash    Current Outpatient Medications on File Prior to Visit  Medication Sig Dispense Refill  . acetaminophen (TYLENOL) 500 MG tablet Take 1,000 mg by mouth every 6 (six) hours as needed.    . cyclobenzaprine (FLEXERIL) 10 MG tablet Take 1 tablet (10 mg total) by mouth every 8  (eight) hours as needed. 90 tablet 3  . meloxicam (MOBIC) 15 MG tablet TAKE 1 TABLET BY MOUTH EVERY DAY 90 tablet 0  . terbinafine (LAMISIL) 250 MG tablet Take 1 tablet (250 mg total) by mouth daily. Use for 12 weeks 90 tablet 0   No current facility-administered medications on file prior to visit.     BP 136/89   Pulse (!) 109   Temp 98.2 F (36.8 C) (Oral)   Resp 16   Ht 5\' 8"  (1.727 m)   Wt 160 lb 3.2 oz (72.7 kg)   SpO2 100%   BMI 24.36 kg/m       Objective:   Physical Exam  General Appearance- Not in acute distress.  HEENT Eyes- Scleraeral/Conjuntiva-bilat- Not Yellow. Mouth & Throat- Normal.  Chest and Lung Exam Auscultation: Breath sounds:-Normal. Adventitious sounds:- No Adventitious sounds.  Cardiovascular Auscultation:Rythm - Regular. Heart Sounds -Normal heart sounds.  Abdomen Inspection:-Inspection Normal.  Palpation/Perucssion: Palpation and Percussion of the abdomen reveal- faint suprapubic Tenderness, No Rebound tenderness, No rigidity(Guarding) and No Palpable abdominal masses.  Liver:-Normal.  Spleen:- Normal.   Back- no cva tenderness..  Rt foot- no swelling. 4th toe faint tender. No bruising.      Assessment & Plan:  Your appear to have a urinary tract infection. I am prescribing cipro  antibiotic for the probable infection. Hydrate well. I am sending out a urine culture. During the interim if your signs and symptoms worsen rather than improving please notify us. We will notify your when the culture results are back.  You toe pain is getting better. We discussed work up options and you decided not to get xray. Likely contusion only. If want xray/change mind on xray just let me know.  Follow up in 7 days or as needed.

## 2018-08-21 NOTE — Patient Instructions (Addendum)
Your appear to have a urinary tract infection. I am prescribing cipro antibiotic for the probable infection. Hydrate well. I am sending out a urine culture. During the interim if your signs and symptoms worsen rather than improving please notify us. We will notify your when the culture results are back.  You toe pain is getting better. We discussed work up options and you decided not to get xray. Likely contusion only.  If want/change mind on  xray just let me know.  Follow up in 7 days or as needed.

## 2018-08-23 ENCOUNTER — Telehealth: Payer: Self-pay | Admitting: Medical

## 2018-08-23 ENCOUNTER — Telehealth: Payer: Self-pay | Admitting: General Practice

## 2018-08-23 LAB — URINE CULTURE
MICRO NUMBER:: 173250
SPECIMEN QUALITY:: ADEQUATE

## 2018-08-23 MED ORDER — CIPROFLOXACIN HCL 500 MG PO TABS: 500.0000 mg | ORAL_TABLET | Freq: Two times a day (BID) | ORAL | 0 refills | Status: DC

## 2018-08-23 NOTE — Telephone Encounter (Signed)
Called pt and advised that she was having a repeat mammogram and [place] on the right breast due to a mass being found. I advised pt that we signed the orders at the protocol of the radiologist. Pt had questions about cost. I advised that she would have to contact the Breast center to talk about pricing. Phone number was given.  Copied from CRM [id number]. Topic: General - Other >> Aug 23, 2018  3:02 PM Percival Spanish wrote:  Pt would like a call back about some test that is scheduled for her on 08/28/2018

## 2018-08-23 NOTE — Telephone Encounter (Signed)
Rx higher dose cipro and for 7 days sent to pt pharmacy.

## 2018-08-23 NOTE — Telephone Encounter (Signed)
Opened to review 

## 2018-08-28 ENCOUNTER — Ambulatory Visit
Admission: RE | Admit: 2018-08-28 | Discharge: 2018-08-28 | Disposition: A | Discharge status: Home / Self Care | Payer: BC Managed Care – PPO | Source: Ambulatory Visit | Attending: Family Medicine | Admitting: Family Medicine

## 2018-08-28 DIAGNOSIS — R928 Other abnormal and inconclusive findings on diagnostic imaging of breast: Secondary | ICD-10-CM

## 2018-08-28 LAB — HM MAMMOGRAPHY

## 2018-09-06 ENCOUNTER — Ambulatory Visit: Payer: BC Managed Care – PPO | Admitting: Medical

## 2018-09-06 VITALS — BP 126/76 | HR 97 | Temp 98.5°F | Resp 16 | Ht 68.0 in | Wt 161.0 lb

## 2018-09-06 DIAGNOSIS — J069 Acute upper respiratory infection, unspecified: Secondary | ICD-10-CM

## 2018-09-06 MED ORDER — AZITHROMYCIN 250 MG PO TABS: ORAL_TABLET | ORAL | 0 refills | Status: DC

## 2018-09-06 MED ORDER — ALBUTEROL SULFATE HFA 108 (90 BASE) MCG/ACT IN AERS: 2.0000 | INHALATION_SPRAY | Freq: Four times a day (QID) | RESPIRATORY_TRACT | 2 refills | Status: DC | PRN

## 2018-09-06 MED ORDER — FLUTICASONE PROPIONATE 50 MCG/ACT NA SUSP: 2.0000 | Freq: Every day | NASAL | 1 refills | Status: DC

## 2018-09-06 MED ORDER — BENZONATATE 100 MG PO CAPS: 100.0000 mg | ORAL_CAPSULE | Freq: Three times a day (TID) | ORAL | 0 refills | Status: DC | PRN

## 2018-09-06 NOTE — Progress Notes (Signed)
Subjective:    Patient ID: Alison Anderson, female    DOB: 01-Jul-1956, 63 y.o.   MRN: [id number]  HPI  Pt in with some nasal congestion, chest congestion, and mild rattly sound when she coughs since friday. Pt feels mild fatigue. Pt states cough at times disrupts her sleep.  Pt states maybe faint wheeze if has coughing fit.  Pt husband has autoimmune disease and kidney transplant this past year.  Review of Systems  Constitutional: Positive for fatigue. Negative for chills.       Mild fatigue.  HENT: Positive for congestion. Negative for mouth sores, postnasal drip, sinus pressure, sinus pain and sore throat.   Respiratory: Positive for cough. Negative for chest tightness, shortness of breath and wheezing.   Cardiovascular: Negative for chest pain and palpitations.  Gastrointestinal: Negative for abdominal pain.  Musculoskeletal: Negative for back pain and myalgias.  Skin: Negative for rash.  Neurological: Negative for dizziness, syncope and weakness.  Hematological: Negative for adenopathy. Does not bruise/bleed easily.  Psychiatric/Behavioral: Negative for behavioral problems and confusion.    Past Medical History:  Diagnosis Date  . Lumbar herniated disc   . Menopause    at 39     Social History   Socioeconomic History  . Marital status: Married    Spouse name: Not on file  . Number of children: Not on file  . Years of education: Not on file  . Highest education level: Not on file  Occupational History  . Not on file  Social Needs  . Financial resource strain: Not on file  . Food insecurity:    Worry: Not on file    Inability: Not on file  . Transportation needs:    Medical: Not on file    Non-medical: Not on file  Tobacco Use  . Smoking status: Never Smoker  . Smokeless tobacco: Never Used  Substance and Sexual Activity  . Alcohol use: Yes    Alcohol/week: 0.0 standard drinks    Comment: rarely  . Drug use: No  . Sexual activity: Not on file  Lifestyle   . Physical activity:    Days per week: Not on file    Minutes per session: Not on file  . Stress: Not on file  Relationships  . Social connections:    Talks on phone: Not on file    Gets together: Not on file    Attends religious service: Not on file    Active member of club or organization: Not on file    Attends meetings of clubs or organizations: Not on file    Relationship status: Not on file  . Intimate partner violence:    Fear of current or ex partner: Not on file    Emotionally abused: Not on file    Physically abused: Not on file    Forced sexual activity: Not on file  Other Topics Concern  . Not on file  Social History Narrative   Lives with husband and daughter Baxter Hire   Works as a Midwife at [employer]    Past Surgical History:  Procedure Laterality Date  . ABDOMINAL HYSTERECTOMY  2012  . back surgery  05/2016   herniated disc, pinched nerve   . CHOLECYSTECTOMY      Family History  Problem Relation Age of Onset  . Diabetes Mother   . Heart disease Mother   . Hypertension Mother   . Diabetes Father        borderline  . Cancer Maternal  Grandmother        breast  . Breast cancer Maternal Grandmother   . Stroke Other        grandparent  . Breast cancer Other        grandmother  . Colon cancer Neg Hx   . Colon polyps Neg Hx   . Esophageal cancer Neg Hx   . Rectal cancer Neg Hx   . Stomach cancer Neg Hx     Allergies  Allergen Reactions  . Tramadol Itching and Rash    Current Outpatient Medications on File Prior to Visit  Medication Sig Dispense Refill  . acetaminophen (TYLENOL) 500 MG tablet Take 1,000 mg by mouth every 6 (six) hours as needed.    . cyclobenzaprine (FLEXERIL) 10 MG tablet Take 1 tablet (10 mg total) by mouth every 8 (eight) hours as needed. 90 tablet 3  . meloxicam (MOBIC) 15 MG tablet TAKE 1 TABLET BY MOUTH EVERY DAY 90 tablet 0  . terbinafine (LAMISIL) 250 MG tablet Take 1 tablet (250 mg total) by mouth daily. Use  for 12 weeks 90 tablet 0   No current facility-administered medications on file prior to visit.     BP 126/76   Pulse 97   Temp 98.5 F (36.9 C) (Oral)   Resp 16   Ht 5\' 8"  (1.727 m)   Wt 161 lb (73 kg)   SpO2 99%   BMI 24.48 kg/m       Objective:   Physical Exam  General  Mental Status - Alert. General Appearance - Well groomed. Not in acute distress.  Skin Rashes- No Rashes.  HEENT Head- Normal. Ear Auditory Canal - Left- Normal. Right - Normal.Tympanic Membrane- Left- Normal. Right- Normal. Eye Sclera/Conjunctiva- Left- Normal. Right- Normal. Nose & Sinuses Nasal Mucosa- Left-  Boggy and Congested. Right-  Boggy and  Congested.Bilateral faint   maxillary and faint ffrontal sinus pressure. Mouth & Throat Lips: Upper Lip- Normal: no dryness, cracking, pallor, cyanosis, or vesicular eruption. Lower Lip-Normal: no dryness, cracking, pallor, cyanosis or vesicular eruption. Buccal Mucosa- Bilateral- No Aphthous ulcers. Oropharynx- No Discharge or Erythema. Tonsils: Characteristics- Bilateral- No Erythema or Congestion. Size/Enlargement- Bilateral- No enlargement. Discharge- bilateral-None.  Neck Neck- Supple. No Masses.   Chest and Lung Exam Auscultation: Breath Sounds:-Clear even and unlabored.  Cardiovascular Auscultation:Rythm- Regular, rate and rhythm. Murmurs & Other Heart Sounds:Ausculatation of the heart reveal- No Murmurs.  Lymphatic Head & Neck General Head & Neck Lymphatics: Bilateral: Description- No Localized lymphadenopathy.      Assessment & Plan:  You appear to have upper respiratory infection verses early bronchitis.  I want you to start flonase for nasal congestion and benzonatate for cough.  Try to rest, get adequate sleep and hydrate.  For wheezing will rx albuterol to use if needed.  If symptoms persist or worsen by Friday then start azithromycin antibiotic. Print rx given.  Follow up 7 days or as needed  [employer] [employer]

## 2018-09-06 NOTE — Patient Instructions (Signed)
You appear to have upper respiratory infection verses early bronchitis.  I want you to start flonase for nasal congestion and benzonatate for cough.  Try to rest, get adequate sleep and hydrate.  For wheezing will rx albuterol to use if needed.  If symptoms persist or worsen by Friday then start azithromycin antibiotic. Print rx given.  Follow up 7 days or as needed

## 2018-09-08 ENCOUNTER — Ambulatory Visit: Payer: BC Managed Care – PPO | Admitting: Medical

## 2018-09-19 ENCOUNTER — Encounter: Payer: Self-pay | Admitting: Family Medicine

## 2018-09-19 ENCOUNTER — Ambulatory Visit: Payer: BC Managed Care – PPO | Admitting: Family Medicine

## 2018-09-19 ENCOUNTER — Ambulatory Visit: Payer: BC Managed Care – PPO | Admitting: Medical

## 2018-09-19 VITALS — BP 158/75 | Ht 67.5 in | Wt 161.0 lb

## 2018-09-19 DIAGNOSIS — S92514A Nondisplaced fracture of proximal phalanx of right lesser toe(s), initial encounter for closed fracture: Secondary | ICD-10-CM

## 2018-09-19 NOTE — Patient Instructions (Signed)
You have a proximal phalanx fracture of your toe. Consider hard soled shoe for 3 weeks otherwise wear a more rigid supportive shoe. Icing, tylenol as needed. Follow up with me as needed - these do not require repeat imaging.

## 2018-09-20 ENCOUNTER — Encounter: Payer: Self-pay | Admitting: Family Medicine

## 2018-09-20 NOTE — Progress Notes (Signed)
PCP: Sheliah Hatch, MD  Subjective:   HPI:  Patient is a 63 y.o. female here for toe injury.  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 Then about a week later she was stepped on again in the same location. Has had swelling, some bruising and a pain level that is a 7 or 8 out of 10 at worst and sharp. Worse with ambulation. Some associated numbness in this toe. No prior injuries. No other skin changes.  Past Medical History:  Diagnosis Date  . Lumbar herniated disc   . Menopause    at 63    Current Outpatient Medications on File Prior to Visit  Medication Sig Dispense Refill  . acetaminophen (TYLENOL) 500 MG tablet Take 1,000 mg by mouth every 6 (six) hours as needed.    Marland Kitchen albuterol (PROVENTIL HFA;VENTOLIN HFA) 108 (90 Base) MCG/ACT inhaler Inhale 2 puffs into the lungs every 6 (six) hours as needed for wheezing or shortness of breath. 1 Inhaler 2  . azithromycin (ZITHROMAX) 250 MG tablet Take 2 tablets by mouth on day 1, followed by 1 tablet by mouth daily for 4 days. 6 tablet 0  . benzonatate (TESSALON) 100 MG capsule Take 1 capsule (100 mg total) by mouth 3 (three) times daily as needed for cough. 30 capsule 0  . cyclobenzaprine (FLEXERIL) 10 MG tablet Take 1 tablet (10 mg total) by mouth every 8 (eight) hours as needed. 90 tablet 3  . fluticasone (FLONASE) 50 MCG/ACT nasal spray Place 2 sprays into both nostrils daily. 16 g 1  . meloxicam (MOBIC) 15 MG tablet TAKE 1 TABLET BY MOUTH EVERY DAY 90 tablet 0  . terbinafine (LAMISIL) 250 MG tablet Take 1 tablet (250 mg total) by mouth daily. Use for 12 weeks 90 tablet 0   No current facility-administered medications on file prior to visit.     Past Surgical History:  Procedure Laterality Date  . ABDOMINAL HYSTERECTOMY  2012  . back surgery  05/2016   herniated disc, pinched nerve   . CHOLECYSTECTOMY      Allergies  Allergen  Reactions  . Tramadol Itching and Rash    Social History   Socioeconomic History  . Marital status: Married    Spouse name: Not on file  . Number of children: Not on file  . Years of education: Not on file  . Highest education level: Not on file  Occupational History  . Not on file  Social Needs  . Financial resource strain: Not on file  . Food insecurity:    Worry: Not on file    Inability: Not on file  . Transportation needs:    Medical: Not on file    Non-medical: Not on file  Tobacco Use  . Smoking status: Never Smoker  . Smokeless tobacco: Never Used  Substance and Sexual Activity  . Alcohol use: Yes    Alcohol/week: 0.0 standard drinks    Comment: rarely  . Drug use: No  . Sexual activity: Not on file  Lifestyle  . Physical activity:    Days per week: Not on file    Minutes per session: Not on file  . Stress: Not on file  Relationships  . Social connections:    Talks on phone: Not on file    Gets together: Not on file    Attends religious service: Not on file    Active member  of club or organization: Not on file    Attends meetings of clubs or organizations: Not on file    Relationship status: Not on file  . Intimate partner violence:    Fear of current or ex partner: Not on file    Emotionally abused: Not on file    Physically abused: Not on file    Forced sexual activity: Not on file  Other Topics Concern  . 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 Diabetes Mother   . Heart disease Mother   . Hypertension Mother   . Diabetes Father        borderline  . Cancer Maternal Grandmother        breast  . Breast cancer Maternal Grandmother   . Stroke Other        grandparent  . Breast cancer Other        grandmother  . Colon cancer Neg Hx   . Colon polyps Neg Hx   . Esophageal cancer Neg Hx   . Rectal cancer Neg  Hx   . Stomach cancer Neg Hx     BP (!) 158/75   Ht 5' 7.5" (1.715 m)   Wt 161 lb (73 kg)   BMI 24.84 kg/m   Review of Systems: See HPI above.     Objective:  Physical Exam:  Gen: NAD, comfortable in exam room  Right foot/ankle: Mod swelling 4th digit throughout.  No other gross deformity, swelling, ecchymoses.  No malrotation or angulation. FROM ankle with 5/5 strength.  Able to flex and extend digits. TTP greatest about 4th MTP joint, proximal phalanx.  No other tenderness. Negative ant drawer and talar tilt.   Thompsons test negative. NV intact distally.  Left foot/ankle: No deformity. FROM with 5/5 strength. No tenderness to palpation. NVI distally.  MSK u/s right foot: Cortical irregularity with neovascularity the base of the fourth proximal phalanx consistent with healing fracture.  No abnormalities of the fourth metatarsal.  No other abnormalities of the fourth digit.   Assessment & Plan:  1.  Fourth digit proximal phalanx fracture: Patient is about 3 weeks out from this injury.  We discussed wearing a hard soled shoe for 3 more weeks and expect this to heal without requiring additional imaging or work-up.  She will ice or take Tylenol as needed.  Follow-up as needed.

## 2018-09-28 ENCOUNTER — Other Ambulatory Visit: Payer: Self-pay | Admitting: Medical

## 2018-10-04 ENCOUNTER — Other Ambulatory Visit: Payer: Self-pay | Admitting: Family Medicine

## 2018-11-07 ENCOUNTER — Other Ambulatory Visit: Payer: Self-pay | Admitting: Family Medicine

## 2018-11-07 NOTE — Telephone Encounter (Signed)
Last OV 06/26/18 lamisil last filled 08/14/18 #90 with 0

## 2018-11-23 ENCOUNTER — Other Ambulatory Visit: Payer: Self-pay | Admitting: Family Medicine

## 2018-11-23 NOTE — Telephone Encounter (Signed)
Last OV 06/26/18 Flexeril last filled 06/26/18 #90 with 3 Pt also has meloxicam on med list #90 with 0 given 10/04/18

## 2018-12-25 ENCOUNTER — Ambulatory Visit (INDEPENDENT_AMBULATORY_CARE_PROVIDER_SITE_OTHER): Payer: BC Managed Care – PPO | Admitting: Family Medicine

## 2018-12-25 ENCOUNTER — Encounter: Payer: Self-pay | Admitting: Family Medicine

## 2018-12-25 ENCOUNTER — Other Ambulatory Visit: Payer: Self-pay

## 2018-12-25 VITALS — BP 132/81 | Temp 97.2°F | Ht 68.0 in | Wt 169.2 lb

## 2018-12-25 DIAGNOSIS — E663 Overweight: Secondary | ICD-10-CM | POA: Diagnosis not present

## 2018-12-25 DIAGNOSIS — Z0000 Encounter for general adult medical examination without abnormal findings: Secondary | ICD-10-CM

## 2018-12-25 NOTE — Progress Notes (Signed)
   Virtual Visit via Video   I connected with patient on 12/25/18 at  3:30 PM EDT by a video enabled telemedicine application and verified that I am speaking with the correct person using two identifiers.  Location patient: Home Location provider: Acupuncturist, Office Persons participating in the virtual visit: Patient, Provider, [place] (Jess B)  I discussed the limitations of evaluation and management by telemedicine and the availability of in person appointments. The patient expressed understanding and agreed to proceed.  Subjective:   HPI:   CPE- UTD on mammo, colonoscopy, immunizations.  No need for pap due to hysterectomy.  Exercising regularly  ROS:  Patient reports no vision/ hearing changes, adenopathy,fever, weight change,  persistant/recurrent hoarseness , swallowing issues, chest pain, palpitations, edema, persistant/recurrent cough, hemoptysis, dyspnea (rest/exertional/paroxysmal nocturnal), gastrointestinal bleeding (melena, rectal bleeding), abdominal pain, significant heartburn, bowel changes, GU symptoms (dysuria, hematuria, incontinence), Gyn symptoms (abnormal  bleeding, pain),  syncope, focal weakness, memory loss, numbness & tingling, skin/hair/nail changes, abnormal bruising or bleeding, anxiety, or depression.   Patient Active Problem List   Diagnosis Date Noted  . Low back pain with right-sided sciatica 12/09/2015  . Physical exam 06/05/2014  . Lumbar paraspinal muscle spasm 06/15/2013    Social History   Tobacco Use  . Smoking status: Never Smoker  . Smokeless tobacco: Never Used  Substance Use Topics  . Alcohol use: Yes    Alcohol/week: 0.0 standard drinks    Comment: rarely    Current Outpatient Medications:  .  acetaminophen (TYLENOL) 500 MG tablet, Take 1,000 mg by mouth every 6 (six) hours as needed., Disp: , Rfl:  .  cyclobenzaprine (FLEXERIL) 10 MG tablet, TAKE 1 TABLET BY MOUTH EVERY 8 HOURS AS NEEDED, Disp: 90 tablet, Rfl: 1 .  fluticasone  (FLONASE) 50 MCG/ACT nasal spray, SPRAY 2 SPRAYS INTO EACH NOSTRIL EVERY DAY, Disp: 16 g, Rfl: 6 .  meloxicam (MOBIC) 15 MG tablet, TAKE 1 TABLET BY MOUTH EVERY DAY, Disp: 90 tablet, Rfl: 0 .  terbinafine (LAMISIL) 250 MG tablet, Take 1 tablet (250 mg total) by mouth daily. Use for 12 weeks (Patient not taking: Reported on 12/25/2018), Disp: 90 tablet, Rfl: 0  Allergies  Allergen Reactions  . Tramadol Itching and Rash    Objective:   BP 132/81   Temp (!) 97.2 F (36.2 C) (Tympanic)   Ht 5\' 8"  (1.727 m)   Wt 169 lb 4 oz (76.8 kg)   BMI 25.73 kg/m   AAOx3, NAD NCAT, EOMI No obvious CN deficits Coloring WNL Pt is able to speak clearly, coherently without shortness of breath or increased work of breathing.  Thought process is linear.  Mood is appropriate.   Assessment and Plan:   CPE- UTD on mammo, colonoscopy, immunizations.  Check labs.  Anticipatory guidance provided.    Annye Asa, MD 12/25/2018

## 2018-12-25 NOTE — Progress Notes (Signed)
I have discussed the procedure for the virtual visit with the patient who has given consent to proceed with assessment and treatment.   Jessica L Brodmerkel, CMA     

## 2018-12-29 ENCOUNTER — Other Ambulatory Visit (INDEPENDENT_AMBULATORY_CARE_PROVIDER_SITE_OTHER): Payer: BC Managed Care – PPO

## 2018-12-29 DIAGNOSIS — E663 Overweight: Secondary | ICD-10-CM

## 2018-12-29 LAB — HEPATIC FUNCTION PANEL
ALT: 11 U/L (ref 0–35)
AST: 18 U/L (ref 0–37)
Albumin: 4.2 g/dL (ref 3.5–5.2)
Alkaline Phosphatase: 60 U/L (ref 39–117)
Bilirubin, Direct: 0.1 mg/dL (ref 0.0–0.3)
Total Bilirubin: 0.4 mg/dL (ref 0.2–1.2)
Total Protein: 6.6 g/dL (ref 6.0–8.3)

## 2018-12-29 LAB — BASIC METABOLIC PANEL
BUN: 24 mg/dL — ABNORMAL HIGH (ref 6–23)
CO2: 25 mEq/L (ref 19–32)
Calcium: 9.4 mg/dL (ref 8.4–10.5)
Chloride: 106 mEq/L (ref 96–112)
Creatinine, Ser: 1.08 mg/dL (ref 0.40–1.20)
GFR: 51.41 mL/min — ABNORMAL LOW (ref >60.00)
Glucose, Bld: 104 mg/dL — ABNORMAL HIGH (ref 70–99)
Potassium: 4.2 mEq/L (ref 3.5–5.1)
Sodium: 142 mEq/L (ref 135–145)

## 2018-12-29 LAB — LIPID PANEL
Cholesterol: 194 mg/dL (ref 0–200)
HDL: 69 mg/dL (ref >39.00)
LDL Cholesterol: 107 mg/dL — ABNORMAL HIGH (ref 0–99)
NonHDL: 125.49
Total CHOL/HDL Ratio: 3
Triglycerides: 92 mg/dL (ref 0.0–149.0)
VLDL: 18.4 mg/dL (ref 0.0–40.0)

## 2018-12-29 LAB — CBC WITH DIFFERENTIAL/PLATELET
Basophils Absolute: 0.1 10*3/uL (ref 0.0–0.1)
Basophils Relative: 0.8 % (ref 0.0–3.0)
Eosinophils Absolute: 0.6 10*3/uL (ref 0.0–0.7)
Eosinophils Relative: 6.1 % — ABNORMAL HIGH (ref 0.0–5.0)
HCT: 37.6 % (ref 36.0–46.0)
Hemoglobin: 12.1 g/dL (ref 12.0–15.0)
Lymphocytes Relative: 41.6 % (ref 12.0–46.0)
Lymphs Abs: 4.2 10*3/uL — ABNORMAL HIGH (ref 0.7–4.0)
MCHC: 32.3 g/dL (ref 30.0–36.0)
MCV: 93.5 fl (ref 78.0–100.0)
Monocytes Absolute: 0.6 10*3/uL (ref 0.1–1.0)
Monocytes Relative: 5.9 % (ref 3.0–12.0)
Neutro Abs: 4.6 10*3/uL (ref 1.4–7.7)
Neutrophils Relative %: 45.6 % (ref 43.0–77.0)
Platelets: 283 10*3/uL (ref 150.0–400.0)
RBC: 4.02 Mil/uL (ref 3.87–5.11)
RDW: 14.7 % (ref 11.5–15.5)
WBC: 10 10*3/uL (ref 4.0–10.5)

## 2018-12-29 LAB — TSH: TSH: 1.68 u[IU]/mL (ref 0.35–4.50)

## 2018-12-30 ENCOUNTER — Other Ambulatory Visit: Payer: Self-pay | Admitting: Family Medicine

## 2019-01-01 NOTE — Telephone Encounter (Signed)
Ok for 90

## 2019-02-06 ENCOUNTER — Other Ambulatory Visit: Payer: Self-pay | Admitting: Family Medicine

## 2019-03-30 ENCOUNTER — Other Ambulatory Visit: Payer: Self-pay | Admitting: Family Medicine

## 2019-03-30 NOTE — Telephone Encounter (Signed)
[place] for #90 on meloxicam?

## 2019-04-02 ENCOUNTER — Telehealth: Payer: Self-pay | Admitting: *Deleted

## 2019-04-02 NOTE — Telephone Encounter (Signed)
Patient called in stating that she has a history of UTI's. She said she is having symptoms again and normally she knows she has to come in for an appointment to get medication.  She is asking if there is any way that Dr. Birdie Riddle would be willing to make an exception this time as she really does not want to come in. She states that her husband is immunocompromised and she does not want to take anything home to him that she would pick up from going out. She asked that I send a note back just to see if she'd be willing to call in medication this time.   CB: [phone]

## 2019-04-02 NOTE — Telephone Encounter (Signed)
Pt does not have mychart. She would need at least a virtual visit.

## 2019-04-02 NOTE — Telephone Encounter (Signed)
Pt will need a MyChart visit at the least- where she needs to write in describing her symptoms, when they started, etc.  She needs to consent to proceed w/ a MyChart visit.  Then I can treat accordingly

## 2019-04-02 NOTE — Telephone Encounter (Signed)
Patient is scheduled for a Doxy appointment tomorrow morning at 9am

## 2019-04-03 ENCOUNTER — Encounter: Payer: Self-pay | Admitting: Family Medicine

## 2019-04-03 ENCOUNTER — Ambulatory Visit (INDEPENDENT_AMBULATORY_CARE_PROVIDER_SITE_OTHER): Payer: BC Managed Care – PPO | Admitting: Family Medicine

## 2019-04-03 ENCOUNTER — Other Ambulatory Visit: Payer: Self-pay

## 2019-04-03 VITALS — BP 121/74 | HR 96 | Temp 97.5°F | Ht 67.5 in | Wt 169.0 lb

## 2019-04-03 DIAGNOSIS — R300 Dysuria: Secondary | ICD-10-CM

## 2019-04-03 MED ORDER — CEPHALEXIN 500 MG PO CAPS: 500.0000 mg | ORAL_CAPSULE | Freq: Two times a day (BID) | ORAL | 0 refills | Status: AC

## 2019-04-03 NOTE — Progress Notes (Signed)
I have discussed the procedure for the virtual visit with the patient who has given consent to proceed with assessment and treatment.   Alison Anderson, CMA     

## 2019-04-03 NOTE — Progress Notes (Signed)
   Virtual Visit via Video   I connected with patient on 04/03/19 at  9:00 AM EDT by a video enabled telemedicine application and verified that I am speaking with the correct person using two identifiers.  Location patient: Home Location provider: Acup[facility]ist, Office Persons participating in the virtual visit: Patient, Provider, Alison (Jess B)  I discussed the limitations of evaluation and management by telemedicine and the availability of in person appointments. The patient expressed understanding and agreed to proceed.  Interactive audio and video telecommunications were attempted between this provider and patient, however failed, due to patient having technical difficulties OR patient did not have access to video capability.  We continued and completed visit with audio only.   Subjective:   HPI:   Dysuria- sxs started Wednesday/Thursday w/ a 'pulling sensation'.  Took OTC UTI medication w/ some relief over the weekend.  Increased frequency, urgency, incomplete emptying.  No blood in urine.  No fevers.  Mild LBP.  ROS:   See pertinent positives and negatives per HPI.  Patient Active Problem List   Diagnosis Date Noted  . Overweight (BMI 25.0-29.9) 12/25/2018  . Low back pain with right-sided sciatica 12/09/2015  . Physical exam 06/05/2014  . Lumbar paraspinal muscle spasm 06/15/2013    Social History   Tobacco Use  . Smoking status: Never Smoker  . Smokeless tobacco: Never Used  Substance Use Topics  . Alcohol use: Yes    Alcohol/week: 0.0 standard drinks    Comment: rarely    Current Outpatient Medications:  .  acetaminophen (TYLENOL) 500 MG tablet, Take 1,000 mg by mouth every 6 (six) hours as needed., Disp: , Rfl:  .  cyclobenzaprine (FLEXERIL) 10 MG tablet, TAKE 1 TABLET BY MOUTH EVERY 8 HOURS AS NEEDED, Disp: 90 tablet, Rfl: 1 .  fluticasone (FLONASE) 50 MCG/ACT nasal spray, SPRAY 2 SPRAYS INTO EACH NOSTRIL EVERY DAY, Disp: 16 g, Rfl: 6 .  meloxicam (MOBIC)  15 MG tablet, TAKE 1 TABLET BY MOUTH EVERY DAY, Disp: 90 tablet, Rfl: 0 .  terbinafine (LAMISIL) 250 MG tablet, Take 1 tablet (250 mg total) by mouth daily. Use for 12 weeks (Patient not taking: Reported on 12/25/2018), Disp: 90 tablet, Rfl: 0  Allergies  Allergen Reactions  . Tramadol Itching and Rash    Objective:   BP 121/74   Pulse 96   Temp (!) 97.5 F (36.4 C) (Oral)   Ht 5' 7.5" (1.715 m)   Wt 169 lb (76.7 kg)   BMI 26.08 kg/m   Pt is able to speak clearly, coherently without shortness of breath or increased work of breathing.  Thought process is linear.  Mood is appropriate.   Assessment and Plan:   Dysuria- pt has hx of UTI and reports that this feels similar.  States, 'I know what this is'.  Start Keflex, increase fluids.  If no improvement will need to do UA and culture.  Pt expressed understanding and is in agreement w/ plan.    Annye Asa, MD 04/03/2019

## 2019-04-16 ENCOUNTER — Other Ambulatory Visit: Payer: Self-pay | Admitting: Family Medicine

## 2019-06-20 ENCOUNTER — Other Ambulatory Visit: Payer: Self-pay | Admitting: Family Medicine

## 2019-07-12 ENCOUNTER — Other Ambulatory Visit: Payer: Self-pay | Admitting: Family Medicine

## 2019-08-27 ENCOUNTER — Other Ambulatory Visit: Payer: Self-pay | Admitting: Family Medicine

## 2019-10-08 ENCOUNTER — Other Ambulatory Visit: Payer: Self-pay | Admitting: Family Medicine

## 2019-10-29 ENCOUNTER — Other Ambulatory Visit: Payer: Self-pay | Admitting: Family Medicine

## 2019-12-31 ENCOUNTER — Other Ambulatory Visit: Payer: Self-pay | Admitting: Family Medicine

## 2020-01-07 ENCOUNTER — Other Ambulatory Visit: Payer: Self-pay | Admitting: Family Medicine

## 2020-03-18 ENCOUNTER — Other Ambulatory Visit: Payer: Self-pay | Admitting: Family Medicine

## 2020-03-25 ENCOUNTER — Other Ambulatory Visit: Payer: Self-pay | Admitting: Family Medicine

## 2020-04-09 ENCOUNTER — Other Ambulatory Visit: Payer: Self-pay

## 2020-04-09 ENCOUNTER — Encounter: Payer: Self-pay | Admitting: Family Medicine

## 2020-04-09 ENCOUNTER — Ambulatory Visit (INDEPENDENT_AMBULATORY_CARE_PROVIDER_SITE_OTHER): Payer: BC Managed Care – PPO | Admitting: Family Medicine

## 2020-04-09 VITALS — BP 122/82 | HR 86 | Temp 97.9°F | Resp 16 | Ht 68.0 in | Wt 169.1 lb

## 2020-04-09 DIAGNOSIS — R350 Frequency of micturition: Secondary | ICD-10-CM | POA: Diagnosis not present

## 2020-04-09 LAB — POCT URINALYSIS DIPSTICK (MANUAL)
Nitrite, UA: NEGATIVE
Poct Bilirubin: NEGATIVE
Poct Blood: NEGATIVE
Poct Glucose: NORMAL mg/dL
Poct Protein: 30 mg/dL — AB
Poct Urobilinogen: 1 mg/dL — AB
Spec Grav, UA: 1.015 (ref 1.010–1.025)
pH, UA: 5 (ref 5.0–8.0)

## 2020-04-09 MED ORDER — CEPHALEXIN 500 MG PO CAPS: 500.0000 mg | ORAL_CAPSULE | Freq: Two times a day (BID) | ORAL | 0 refills | Status: AC

## 2020-04-09 NOTE — Progress Notes (Signed)
   Subjective:    Patient ID: Alison Anderson, female    DOB: 1956-04-14, 64 y.o.   MRN: [id number]  HPI Urinary frequency- sxs started ~1 week ago.  Took AZO for a few days w/ temporary relief.  Pt reports 'localized pain where I pee'.  sxs worsen w/ activity.  Has an 'urgency and a pulling sensation'.   + incomplete emptying.  Has distended bladder- treated by urology.  No blood in urine.  No fevers.  No CVA pain.   Review of Systems For ROS see HPI   This visit occurred during the SARS-CoV-2 public health emergency.  Safety protocols were in place, including screening questions prior to the visit, additional usage of staff PPE, and extensive cleaning of exam room while observing appropriate contact time as indicated for disinfecting solutions.       Objective:   Physical Exam Vitals reviewed.  Constitutional:      Appearance: She is well-developed. She is not ill-appearing.  Abdominal:     General: There is no distension.     Palpations: Abdomen is soft.     Tenderness: There is no abdominal tenderness (no suprapubic or CVA tenderness).  Skin:    General: Skin is warm and dry.  Neurological:     General: No focal deficit present.     Mental Status: She is alert and oriented to person, place, and time.  Psychiatric:        Mood and Affect: Mood normal.        Behavior: Behavior normal.           Assessment & Plan:  Urinary frequency- pt has hx of recurrent UTIs and states this feels similar to previous.  UA is suspicious for infxn.  Start Keflex while awaiting culture.  Pt expressed understanding and is in agreement w/ plan.

## 2020-04-09 NOTE — Patient Instructions (Signed)
Follow up as needed or as scheduled START the Keflex twice daily- take w/ food Drink plenty of fluids Call with any questions or concerns Hang in there!

## 2020-04-11 LAB — URINE CULTURE
MICRO NUMBER:: 11010752
SPECIMEN QUALITY:: ADEQUATE

## 2020-05-26 ENCOUNTER — Other Ambulatory Visit: Payer: Self-pay | Admitting: Family Medicine

## 2020-05-26 NOTE — Telephone Encounter (Signed)
LFD 03/18/20 #90 with 1 refill LOV 04/09/20 for urinary frequency NOV none

## 2020-06-19 ENCOUNTER — Other Ambulatory Visit: Payer: Self-pay | Admitting: Family Medicine

## 2020-06-19 NOTE — Telephone Encounter (Signed)
LFD 03/25/20 #90 with no refills LOV 04/09/20 for urinary frequency NOV none

## 2020-07-22 ENCOUNTER — Other Ambulatory Visit: Payer: Self-pay | Admitting: Family Medicine

## 2020-07-22 DIAGNOSIS — M62830 Muscle spasm of back: Secondary | ICD-10-CM

## 2020-07-22 NOTE — Telephone Encounter (Signed)
Flexeril last rx 05/26/20 #90 1 RF LOV:04/09/20 UTI LOV: 06/26/18 Lumbar paraspinal muscle

## 2020-07-23 ENCOUNTER — Telehealth: Payer: Self-pay | Admitting: Family Medicine

## 2020-07-23 MED ORDER — CYCLOBENZAPRINE HCL 10 MG PO TABS
10.0000 mg | ORAL_TABLET | Freq: Three times a day (TID) | ORAL | 0 refills | Status: DC | PRN
Start: 2020-07-23 — End: 2020-08-04

## 2020-07-23 NOTE — Telephone Encounter (Signed)
Pt called in asking if Selena Batten would be willing to send in a few pill of the [facility] [place]. Pt will be out of the medication tomorrow. She does have an appt. with Dr. Beverely Low on Monday since she did have a cancellation. Pt can be reached at the home #

## 2020-07-23 NOTE — Telephone Encounter (Signed)
Ok to send in quantity 30.

## 2020-07-23 NOTE — Telephone Encounter (Signed)
See below

## 2020-07-23 NOTE — Telephone Encounter (Signed)
Medication has been sent to the patient's pharmacy.  

## 2020-07-28 ENCOUNTER — Telehealth (INDEPENDENT_AMBULATORY_CARE_PROVIDER_SITE_OTHER): Payer: BC Managed Care – PPO | Admitting: Family Medicine

## 2020-07-28 ENCOUNTER — Encounter: Payer: Self-pay | Admitting: Family Medicine

## 2020-07-28 VITALS — BP 116/76 | HR 98 | Temp 97.9°F | Wt 163.2 lb

## 2020-07-28 DIAGNOSIS — M5441 Lumbago with sciatica, right side: Secondary | ICD-10-CM | POA: Diagnosis not present

## 2020-07-28 DIAGNOSIS — G8929 Other chronic pain: Secondary | ICD-10-CM | POA: Diagnosis not present

## 2020-07-28 DIAGNOSIS — R1310 Dysphagia, unspecified: Secondary | ICD-10-CM

## 2020-07-28 NOTE — Progress Notes (Signed)
   Virtual Visit via Video   I connected with patient on 07/28/20 at  9:00 AM EST by a video enabled telemedicine application and verified that I am speaking with the correct person using two identifiers.  Location patient: Home Location provider: [place] [facility], Office Persons participating in the virtual visit: Patient, Provider, CMA (Sabrina M)  I discussed the limitations of evaluation and management by telemedicine and the availability of in person appointments. The patient expressed understanding and agreed to proceed.  Subjective:   HPI:   LBP-  'i'm doing pretty good'.  Pt has to take Flexeril TID in order to prevent back spasms and leg cramps.  Has hx of herniated disc s/p surgery.  Also has scoliosis.  Takes Meloxicam 15mg  daily.  Dysphagia- pt has sensation that food is 'not going down as quickly as it should'.  sxs are more pronounced if she is rushing.  Only occurs w/ food.  Feels that water helps.  ROS:   See pertinent positives and negatives per HPI.  Patient Active Problem List   Diagnosis Date Noted  . Overweight (BMI 25.0-29.9) 12/25/2018  . Low back pain with right-sided sciatica 12/09/2015  . Physical exam 06/05/2014  . Lumbar paraspinal muscle spasm 06/15/2013    Social History   Tobacco Use  . Smoking status: Never Smoker  . Smokeless tobacco: Never Used  Substance Use Topics  . Alcohol use: Yes    Alcohol/week: 0.0 standard drinks    Comment: rarely    Current Outpatient Medications:  .  acetaminophen (TYLENOL) 500 MG tablet, Take 1,000 mg by mouth every 6 (six) hours as needed., Disp: , Rfl:  .  cyclobenzaprine (FLEXERIL) 10 MG tablet, Take 1 tablet (10 mg total) by mouth every 8 (eight) hours as needed., Disp: 30 tablet, Rfl: 0 .  meloxicam (MOBIC) 15 MG tablet, TAKE 1 TABLET BY MOUTH EVERY DAY, Disp: 90 tablet, Rfl: 0 .  fluticasone (FLONASE) 50 MCG/ACT nasal spray, SPRAY 2 SPRAYS INTO EACH NOSTRIL EVERY DAY (Patient not taking: Reported on  07/28/2020), Disp: 16 g, Rfl: 6 .  terbinafine (LAMISIL) 250 MG tablet, Take 1 tablet (250 mg total) by mouth daily. Use for 12 weeks (Patient not taking: No sig reported), Disp: 90 tablet, Rfl: 0  Allergies  Allergen Reactions  . Tramadol Itching and Rash    Objective:   There were no vitals taken for this visit. AAOx3, NAD NCAT, EOMI No obvious CN deficits Coloring WNL Pt is able to speak clearly, coherently without shortness of breath or increased work of breathing.  Thought process is linear.  Mood is appropriate.   Assessment and Plan:   Chronic R sided LBP- ongoing issue for pt.  Well controlled on her current regimen of Meloxicam and Flexeril.  No med changes at this time.  Will follow.  Dysphagia- new.  Pt reports sxs occur w/ solid food only (not liquids) and are more notable when she is rushing.  She reports it feels as if food is 'stuck' or 'doesn't go down like it should'.  Water can improve symptoms.  Discussed need for GI evaluation/referral.  Pt is agreeable but is very fearful of COVID and will not go to an appt at this time (husband is a renal transplant patient).  She is willing to reassess in the spring.   [id number], MD 07/28/2020

## 2020-07-28 NOTE — Progress Notes (Signed)
I connected with  Alison Anderson on 07/28/20 by a video enabled telemedicine application and verified that I am speaking with the correct person using two identifiers.   I discussed the limitations of evaluation and management by telemedicine. The patient expressed understanding and agreed to proceed.

## 2020-08-04 ENCOUNTER — Other Ambulatory Visit: Payer: Self-pay

## 2020-08-04 ENCOUNTER — Telehealth: Payer: Self-pay | Admitting: Family Medicine

## 2020-08-04 ENCOUNTER — Telehealth: Payer: Self-pay

## 2020-08-04 DIAGNOSIS — M62830 Muscle spasm of back: Secondary | ICD-10-CM

## 2020-08-04 MED ORDER — CYCLOBENZAPRINE HCL 10 MG PO TABS: 10.0000 mg | ORAL_TABLET | Freq: Three times a day (TID) | ORAL | 0 refills | Status: DC | PRN

## 2020-08-04 NOTE — Telephone Encounter (Signed)
Pt called in asking for a 90 day supply on the Flexeril, [facility] [address] [address] She has a VV with Tabori last week. She only has 2 pills left.   Please advise

## 2020-08-04 NOTE — Telephone Encounter (Signed)
Rx sent to pharmacy   

## 2020-08-04 NOTE — Telephone Encounter (Signed)
Patient states she picked up her medication cyclobenzaprine (FLEXERIL) 10 MG tablet and states she received a 30 count instead of her usual 90 amount, patient is just asking when she needs her medication refilled can we make sure to send in the 90 count. Thank you

## 2020-08-04 NOTE — Telephone Encounter (Signed)
Noted  

## 2020-08-13 ENCOUNTER — Telehealth: Payer: Self-pay | Admitting: Family Medicine

## 2020-08-13 ENCOUNTER — Telehealth: Payer: Self-pay

## 2020-08-13 NOTE — Telephone Encounter (Signed)
..  Medication Refills  Last OV:  Medication:  Cyclobenzaprine  [facility] [address] Let patient know to contact pharmacy at the end of the day to make sure medication is ready.   Please notify patient to allow 48-72 hours to process.  Encourage patient to contact the pharmacy for refills or they can request refills through [facility]  Clinical Fills out below:   Last refill:  QTY:  Refill Date:    Other Comments:Patient would like to go back to her original 30 day   Okay for refill?  Please advise.

## 2020-08-13 NOTE — Telephone Encounter (Signed)
Called patient to address concerns of her Flexeril 10mg . She stated that [facility] refilled it for her but only gave her 30 days while you were out of office and would like it to be switched back to the original 90 days so she will not have to get it refilled so often. Ok to fill for original quantity?

## 2020-08-13 NOTE — Telephone Encounter (Signed)
Called and addressed patients concerns.

## 2020-08-14 ENCOUNTER — Telehealth: Payer: Self-pay

## 2020-08-14 ENCOUNTER — Other Ambulatory Visit: Payer: Self-pay

## 2020-08-14 DIAGNOSIS — M62830 Muscle spasm of back: Secondary | ICD-10-CM

## 2020-08-14 MED ORDER — CYCLOBENZAPRINE HCL 10 MG PO TABS: 10.0000 mg | ORAL_TABLET | Freq: Three times a day (TID) | ORAL | 1 refills | Status: DC | PRN

## 2020-08-14 NOTE — Telephone Encounter (Signed)
Ok to send for #90, 1 refill

## 2020-08-14 NOTE — Telephone Encounter (Signed)
Sent refill and called and left patient vm to inform her that refills has been sent to pharmacy.

## 2020-08-14 NOTE — Telephone Encounter (Signed)
Called and left a massage regarding her 90 day supply. Per Dr. Beverely Low ok to refill # 1 refill

## 2020-09-11 ENCOUNTER — Other Ambulatory Visit: Payer: Self-pay | Admitting: Family Medicine

## 2020-09-22 ENCOUNTER — Encounter: Payer: Self-pay | Admitting: Family Medicine

## 2020-09-22 ENCOUNTER — Ambulatory Visit: Payer: BC Managed Care – PPO | Admitting: Family Medicine

## 2020-09-22 ENCOUNTER — Other Ambulatory Visit: Payer: Self-pay

## 2020-09-22 ENCOUNTER — Telehealth: Payer: Self-pay | Admitting: Family Medicine

## 2020-09-22 VITALS — BP 130/80 | HR 120 | Temp 99.1°F | Resp 19 | Ht 68.0 in | Wt 166.6 lb

## 2020-09-22 DIAGNOSIS — S51852A Open bite of left forearm, initial encounter: Secondary | ICD-10-CM | POA: Diagnosis not present

## 2020-09-22 DIAGNOSIS — Z23 Encounter for immunization: Secondary | ICD-10-CM

## 2020-09-22 DIAGNOSIS — L089 Local infection of the skin and subcutaneous tissue, unspecified: Secondary | ICD-10-CM | POA: Diagnosis not present

## 2020-09-22 DIAGNOSIS — W5501XA Bitten by cat, initial encounter: Secondary | ICD-10-CM | POA: Diagnosis not present

## 2020-09-22 MED ORDER — AMOXICILLIN-POT CLAVULANATE 875-125 MG PO TABS: 1.0000 | ORAL_TABLET | Freq: Two times a day (BID) | ORAL | 0 refills | Status: DC

## 2020-09-22 NOTE — Patient Instructions (Signed)
Follow up as needed or as scheduled START the Augmentin twice daily w/ food Monitor the bite carefully- if the redness spreads rapidly outside the circle or spreads after the first 24 hrs of antibiotics, please go to the ER IF you don't hear from animal control in the next 24-48 hrs, we will need to start rabies shots (also in the ER) Call with any questions or concerns Hang in there!!!

## 2020-09-22 NOTE — Progress Notes (Signed)
   Subjective:    Patient ID: Zayley Arras, female    DOB: August 06, 1955, 65 y.o.   MRN: [id number]  HPI Cat bite- pt was feeding stray cats yesterday morning when one appeared to be sick.  She picked it up to bring it inside, it bit her L forearm.  Cat then died.  Animal control has the cat and is testing it for rabies.  Pt reports cat bit and then bit down a 2nd time.   Review of Systems For ROS see HPI   This visit occurred during the SARS-CoV-2 public health emergency.  Safety protocols were in place, including screening questions prior to the visit, additional usage of staff PPE, and extensive cleaning of exam room while observing appropriate contact time as indicated for disinfecting solutions.       Objective:   Physical Exam Vitals reviewed.  Constitutional:      General: She is not in acute distress.    Appearance: Normal appearance. She is not ill-appearing.  HENT:     Head: Normocephalic and atraumatic.  Skin:    General: Skin is warm.     Findings: Erythema (half dollar sized redness surrounding central puncture site, no fluctuance or drainage) present.  Neurological:     General: No focal deficit present.     Mental Status: She is alert and oriented to person, place, and time.  Psychiatric:        Mood and Affect: Mood normal.        Behavior: Behavior normal.        Thought Content: Thought content normal.           Assessment & Plan:  Cat bite- new.  Pt already has ring of erythema surrounding the puncture site.  No TTP, no fluctuance, no drainage.  Will start Augmentin but cautioned pt that if the redness rapidly extends past the border I drew on her arm, or continues to extend despite being on antibiotics, she needs to go to ER.  According to [facility], we are able to hold on rabies prophylaxis at this time b/c animal control has the cat and is testing it for rabies.  If she does not hear from them in the next 48 hours, we will initiate rabies shots.  Pt expressed  understanding and is in agreement w/ plan.

## 2020-09-22 NOTE — Telephone Encounter (Signed)
FYI:  1st dose 10/01/2019 - [facility]          2nd dose 10/22/2019          Booster - 04/29/2020

## 2020-09-22 NOTE — Telephone Encounter (Signed)
I have updated patient chart with covid shot dates.

## 2020-09-23 ENCOUNTER — Telehealth: Payer: Self-pay | Admitting: Family Medicine

## 2020-09-23 NOTE — Telephone Encounter (Signed)
Pt called in stating that there is no redness at the bite sight. She should hear something back from the county nurse about the results of the cat that bit her by Wednesday. She wanted to know if there is anything that she should do in the mean time or should she just wait on the results?  Pt can be reached at the home #

## 2020-09-23 NOTE — Telephone Encounter (Signed)
We can wait on the results until tomorrow

## 2020-09-23 NOTE — Telephone Encounter (Signed)
Called and spoke with patient. Patient understood.

## 2020-09-23 NOTE — Telephone Encounter (Signed)
Patient would like to know if there is anything she needs to do in the meantime until the results come back. Please advise

## 2020-09-24 NOTE — Telephone Encounter (Signed)
FYI no rabies

## 2020-09-24 NOTE — Telephone Encounter (Signed)
Patient called back and states that cat did not have rabies.

## 2020-09-30 ENCOUNTER — Telehealth: Payer: Self-pay | Admitting: Family Medicine

## 2020-09-30 NOTE — Telephone Encounter (Signed)
This wouldn't be a common reaction to the Augmentin but since she only has 2 days left, she can stop the medication.  If her oral symptoms change or worsen, she will need an appt

## 2020-09-30 NOTE — Telephone Encounter (Signed)
WKG:SUPJSR and spoke with patient and I informed her per your order that it would not be a common reaction to the Augmentin. However only having a couple pills left she can stop the medication. I also informed her that if symptoms worsen or change then she needs to be seen. Patient understood. No further concerns at this time.

## 2020-09-30 NOTE — Telephone Encounter (Signed)
Pt called in stating that she has had a change in taste, some bumps on her tongue, and she is having some what of a burning sensation.   She is taking  Augmentin, she wanted to know could this be a side effect from the medication. She has 4 more pills left.    Please advise

## 2020-09-30 NOTE — Telephone Encounter (Signed)
Please advise 

## 2020-10-12 ENCOUNTER — Other Ambulatory Visit: Payer: Self-pay | Admitting: Family Medicine

## 2020-10-12 DIAGNOSIS — M62830 Muscle spasm of back: Secondary | ICD-10-CM

## 2020-12-04 ENCOUNTER — Other Ambulatory Visit: Payer: Self-pay | Admitting: Family Medicine

## 2020-12-04 DIAGNOSIS — M62830 Muscle spasm of back: Secondary | ICD-10-CM

## 2020-12-09 ENCOUNTER — Other Ambulatory Visit: Payer: Self-pay

## 2020-12-09 ENCOUNTER — Encounter: Payer: Self-pay | Admitting: Registered Nurse

## 2020-12-09 ENCOUNTER — Ambulatory Visit: Payer: BC Managed Care – PPO | Admitting: Registered Nurse

## 2020-12-09 VITALS — BP 134/85 | HR 123 | Temp 98.3°F | Resp 18 | Ht 68.0 in | Wt 169.0 lb

## 2020-12-09 DIAGNOSIS — Z23 Encounter for immunization: Secondary | ICD-10-CM

## 2020-12-09 DIAGNOSIS — N390 Urinary tract infection, site not specified: Secondary | ICD-10-CM

## 2020-12-09 DIAGNOSIS — M62830 Muscle spasm of back: Secondary | ICD-10-CM

## 2020-12-09 LAB — POCT URINALYSIS DIP (MANUAL ENTRY)
Bilirubin, UA: NEGATIVE
Glucose, UA: NEGATIVE mg/dL
Ketones, POC UA: NEGATIVE mg/dL
Nitrite, UA: NEGATIVE
Spec Grav, UA: <=1.005 — AB (ref 1.010–1.025)
Urobilinogen, UA: 0.2 E.U./dL
pH, UA: 6 (ref 5.0–8.0)

## 2020-12-09 MED ORDER — SULFAMETHOXAZOLE-TRIMETHOPRIM 800-160 MG PO TABS: 1.0000 | ORAL_TABLET | Freq: Two times a day (BID) | ORAL | 0 refills | Status: DC

## 2020-12-09 MED ORDER — CYCLOBENZAPRINE HCL 10 MG PO TABS: 1.0000 | ORAL_TABLET | Freq: Three times a day (TID) | ORAL | 1 refills | Status: DC | PRN

## 2020-12-09 NOTE — Progress Notes (Signed)
Acute Office Visit  Subjective:    Patient ID: Alison Anderson, female    DOB: 03-09-1956, 65 y.o.   MRN: [id number]  Chief Complaint  Patient presents with  . Urinary Tract Infection    Patient states she thinks she has a UTI. Patient has been experiencing frequent urination and some pressure.    HPI Patient is in today for UTI  Urine frequency and urgency Suprapubic pressure, does note hx of distended bladder. Denies systemic symptoms inc lower back pain, fever, chills, fatigue  Has been some time since last UTI. Has been some time since last abx use.  Also interested in getting shingrix today. Has not had this in the past. Hx of chicken pox as child. Will return in 2-6 mo for second shot.  Past Medical History:  Diagnosis Date  . Lumbar herniated disc   . Menopause    at 20    Past Surgical History:  Procedure Laterality Date  . ABDOMINAL HYSTERECTOMY  2012  . back surgery  05/2016   herniated disc, pinched nerve   . CHOLECYSTECTOMY      Family History  Problem Relation Age of Onset  . Diabetes Mother   . Heart disease Mother   . Hypertension Mother   . Diabetes Father        borderline  . Cancer Maternal Grandmother        breast  . Breast cancer Maternal Grandmother   . Stroke Other        grandparent  . Breast cancer Other        grandmother  . Colon cancer Neg Hx   . Colon polyps Neg Hx   . Esophageal cancer Neg Hx   . Rectal cancer Neg Hx   . Stomach cancer Neg Hx     Social History   Socioeconomic History  . Marital status: Married    Spouse name: Not on file  . Number of children: Not on file  . Years of education: Not on file  . Highest education level: Not on file  Occupational History  . Not on file  Tobacco Use  . Smoking status: Never Smoker  . Smokeless tobacco: Never Used  Substance and Sexual Activity  . Alcohol use: Yes    Alcohol/week: 0.0 standard drinks    Comment: rarely  . Drug use: No  . Sexual activity: Not  Currently  Other Topics Concern  . 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 acetaminophen (TYLENOL) 500 MG tablet Take 1,000 mg by mouth every 6 (six) hours as needed.    . cyclobenzaprine (FLEXERIL) 10 MG tablet TAKE 1 TABLET BY MOUTH EVERY 8 HOURS AS NEEDED. 90 tablet 1  . meloxicam (MOBIC) 15 MG tablet TAKE 1 TABLET BY MOUTH EVERY DAY 90 tablet 0  . amoxicillin-clavulanate (AUGMENTIN) 875-125 MG tablet Take 1 tablet by mouth 2 (two) times daily. (Patient not taking: Reported on 12/09/2020) 20 tablet 0  . fluticasone (FLONASE) 50 MCG/ACT nasal spray SPRAY 2 SPRAYS INTO [facility]  NOSTRIL EVERY DAY (Patient not taking: No sig reported) 16 g 6  . terbinafine (LAMISIL) 250 MG tablet Take 1 tablet (250 mg total) by mouth daily. Use for 12 weeks (Patient not taking: No sig reported) 90 tablet 0   No facility-administered medications prior to visit.    Allergies  Allergen Reactions  . Tramadol Itching and Rash    Review of Systems  Constitutional: Negative.   HENT: Negative.   Eyes: Negative.   Respiratory: Negative.   Cardiovascular: Negative.   Gastrointestinal: Negative.   Genitourinary: Negative.   Musculoskeletal: Negative.   Skin: Negative.   Neurological: Negative.   Psychiatric/Behavioral: Negative.   All other systems reviewed and are negative.  Per hpi, otherwise negative    Objective:    Physical Exam Vitals and nursing note reviewed.  Constitutional:      General: She is not in acute distress.    Appearance: Normal appearance. She is not  ill-appearing, toxic-appearing or diaphoretic.  Cardiovascular:     Rate and Rhythm: Normal rate and regular rhythm.     Pulses: Normal pulses.     Heart sounds: Normal heart sounds. No murmur heard. No friction rub. No gallop.   Pulmonary:     Effort: Pulmonary effort is normal. No respiratory distress.     Breath sounds: Normal breath sounds. No stridor. No wheezing, rhonchi or rales.  Chest:     Chest wall: No tenderness.  Skin:    General: Skin is warm and dry.     Capillary Refill: Capillary refill takes less than 2 seconds.  Neurological:     General: No focal deficit present.     Mental Status: She is alert and oriented to person, place, and time. Mental status is at baseline.  Psychiatric:        Mood and Affect: Mood normal.        Behavior: Behavior normal.        Thought Content: Thought content normal.        Judgment: Judgment normal.     BP 134/85   Pulse (!) 123   Temp 98.3 F (36.8 C) (Temporal)   Resp 18   Ht 5\' 8"  (1.727 m)   Wt 169 lb (76.7 kg)   SpO2 99%   BMI 25.70 kg/m  Wt Readings from Last 3 Encounters:  12/09/20 169 lb (76.7 kg)  09/22/20 166 lb 9.6 oz (75.6 kg)  07/28/20 163 lb 3.2 oz (74 kg)    Health Maintenance Due  Topic Date Due  . Zoster Vaccines- Shingrix (1 of 2) Never done  . MAMMOGRAM  08/28/2020    There are no preventive care reminders to display for this patient.   Lab Results  Component Value Date   TSH 1.68 12/29/2018   Lab Results  Component Value Date   WBC 10.0 12/29/2018   HGB 12.1 12/29/2018   HCT 37.6 12/29/2018   MCV 93.5 12/29/2018   PLT 283.0 12/29/2018   Lab Results  Component Value Date   NA 142 12/29/2018   K 4.2 12/29/2018   CO2 25 12/29/2018   GLUCOSE 104 (H) 12/29/2018   BUN 24 (H) 12/29/2018   CREATININE 1.08 12/29/2018   BILITOT 0.4 12/29/2018   ALKPHOS 60 12/29/2018   AST 18 12/29/2018   ALT 11 12/29/2018   PROT 6.6 12/29/2018   ALBUMIN 4.2 12/29/2018   CALCIUM 9.4 12/29/2018   GFR  51.41 (L) 12/29/2018   Lab Results  Component Value Date  CHOL 194 12/29/2018   Lab Results  Component Value Date   HDL 69.00 12/29/2018   Lab Results  Component Value Date   LDLCALC 107 (H) 12/29/2018   Lab Results  Component Value Date   TRIG 92.0 12/29/2018   Lab Results  Component Value Date   CHOLHDL 3 12/29/2018   No results found for: HGBA1C     Assessment & Plan:   Problem List Items Addressed This Visit   None   Visit Diagnoses    Urinary tract infection without hematuria, site unspecified    -  Primary   Relevant Orders   POCT urinalysis dipstick       No orders of the defined types were placed in this encounter.  PLAN  Bactrim PO BID for three days  Continue OTC analgesics prn  Will check into flexeril rx - pt concerned for short fill  Culture sent  Shingles vaccine given.  Patient encouraged to call clinic with any questions, comments, or concerns.   Janeece Agee, NP

## 2020-12-09 NOTE — Progress Notes (Signed)
poct

## 2020-12-09 NOTE — Patient Instructions (Signed)
Ms Alison Anderson -  Nice to meet you  Sorry that you're not feeling well.  Bactrim by mouth twice daily for three days Ok to use tylenol for relief of any aches or pains  If symptoms persist let me know We are sending a urine culture out  I'll CC Dr. Beverely Low to let her know about the flexeril  Thank you  Rich

## 2020-12-11 LAB — URINE CULTURE
MICRO NUMBER:: 11951237
SPECIMEN QUALITY:: ADEQUATE

## 2020-12-15 ENCOUNTER — Encounter: Payer: Self-pay | Admitting: Family Medicine

## 2020-12-15 ENCOUNTER — Ambulatory Visit (INDEPENDENT_AMBULATORY_CARE_PROVIDER_SITE_OTHER): Payer: BC Managed Care – PPO | Admitting: Family Medicine

## 2020-12-15 ENCOUNTER — Other Ambulatory Visit: Payer: Self-pay

## 2020-12-15 VITALS — BP 130/80 | HR 113 | Temp 97.9°F | Resp 20 | Ht 67.0 in | Wt 167.0 lb

## 2020-12-15 DIAGNOSIS — Z0000 Encounter for general adult medical examination without abnormal findings: Secondary | ICD-10-CM

## 2020-12-15 DIAGNOSIS — R1310 Dysphagia, unspecified: Secondary | ICD-10-CM | POA: Diagnosis not present

## 2020-12-15 DIAGNOSIS — E663 Overweight: Secondary | ICD-10-CM

## 2020-12-15 DIAGNOSIS — Z1231 Encounter for screening mammogram for malignant neoplasm of breast: Secondary | ICD-10-CM

## 2020-12-15 NOTE — Assessment & Plan Note (Signed)
Encouraged healthy diet and regular exercise.  Check labs to risk stratify.  Will follow. ?

## 2020-12-15 NOTE — Progress Notes (Signed)
   Subjective:    Patient ID: Alison Anderson, female    DOB: 10-22-1955, 65 y.o.   MRN: [id number]  HPI CPE- due for mammo.  UTD on colonoscopy, Tdap, flu, COVID.  Reviewed past medical, surgical, family and social histories.   Health Maintenance  Topic Date Due  . MAMMOGRAM  08/28/2020  . Hepatitis C Screening  04/09/2021 (Originally 01/06/1974)  . HIV Screening  04/09/2021 (Originally 01/06/1971)  . Zoster Vaccines- Shingrix (2 of 2) 02/03/2021  . INFLUENZA VACCINE  02/09/2021  . COLONOSCOPY (Pts 45-41yrs Insurance coverage will need to be confirmed)  12/04/2026  . TETANUS/TDAP  09/23/2030  . COVID-19 Vaccine  Completed  . Pneumococcal Vaccine 68-7 Years old  Aged Out  . HPV VACCINES  Aged Out      Review of Systems Patient reports no vision/ hearing changes, adenopathy,fever, weight change,  persistant/recurrent hoarseness, chest pain, palpitations, edema, persistant/recurrent cough, hemoptysis, dyspnea (rest/exertional/paroxysmal nocturnal), gastrointestinal bleeding (melena, rectal bleeding), abdominal pain, significant heartburn, bowel changes, GU symptoms (dysuria, hematuria, incontinence), Gyn symptoms (abnormal  bleeding, pain),  syncope, focal weakness, memory loss, numbness & tingling, skin/hair/nail changes, abnormal bruising or bleeding, anxiety, or depression.   + dysphagia- occurs w/ solids  This visit occurred during the SARS-CoV-2 public health emergency.  Safety protocols were in place, including screening questions prior to the visit, additional usage of staff PPE, and extensive cleaning of exam room while observing appropriate contact time as indicated for disinfecting solutions.       Objective:   Physical Exam General Appearance:    Alert, cooperative, no distress, appears stated age  Head:    Normocephalic, without obvious abnormality, atraumatic  Eyes:    PERRL, conjunctiva/corneas clear, EOM's intact, fundi    benign, both eyes  Ears:    Normal TM's and  external ear canals, both ears  Nose:   Deferred due to COVID  Throat:   Neck:   Supple, symmetrical, trachea midline, no adenopathy;    Thyroid: no enlargement/tenderness/nodules  Back:     Symmetric, no curvature, ROM normal, no CVA tenderness  Lungs:     Clear to auscultation bilaterally, respirations unlabored  Chest Wall:    No tenderness or deformity   Heart:    Regular rate and rhythm, S1 and S2 normal, no murmur, rub   or gallop  Breast Exam:    Deferred to deferred to mammo  Abdomen:     Soft, non-tender, bowel sounds active all four quadrants,    no masses, no organomegaly  Genitalia:    Deferred to GYN  Rectal:    Extremities:   Extremities normal, atraumatic, no cyanosis or edema  Pulses:   2+ and symmetric all extremities  Skin:   Skin color, texture, turgor normal, no rashes or lesions  Lymph nodes:   Cervical, supraclavicular, and axillary nodes normal  Neurologic:   CNII-XII intact, normal strength, sensation and reflexes    throughout          Assessment & Plan:

## 2020-12-15 NOTE — Patient Instructions (Addendum)
Follow up in 1 year or as needed We'll notify you of your lab results and make any changes if needed Continue to work on healthy diet and regular exercise- you can do it! [facility] to schedule your mammogram Call with any questions or concerns Stay Safe!  Stay Healthy! Have a great summer!!!

## 2020-12-15 NOTE — Assessment & Plan Note (Signed)
Pt's PE WNL.  Due for mammo- ordered.  UTD on colonoscopy and immunizations.  Check labs.  Anticipatory guidance provided.

## 2020-12-19 ENCOUNTER — Other Ambulatory Visit (INDEPENDENT_AMBULATORY_CARE_PROVIDER_SITE_OTHER): Payer: BC Managed Care – PPO

## 2020-12-19 ENCOUNTER — Other Ambulatory Visit: Payer: Self-pay

## 2020-12-19 DIAGNOSIS — E663 Overweight: Secondary | ICD-10-CM | POA: Diagnosis not present

## 2020-12-19 LAB — CBC WITH DIFFERENTIAL/PLATELET
Basophils Absolute: 0.1 10*3/uL (ref 0.0–0.1)
Basophils Relative: 1 % (ref 0.0–3.0)
Eosinophils Absolute: 0.4 10*3/uL (ref 0.0–0.7)
Eosinophils Relative: 3.9 % (ref 0.0–5.0)
HCT: 36.8 % (ref 36.0–46.0)
Hemoglobin: 12 g/dL (ref 12.0–15.0)
Lymphocytes Relative: 27.1 % (ref 12.0–46.0)
Lymphs Abs: 2.6 10*3/uL (ref 0.7–4.0)
MCHC: 32.5 g/dL (ref 30.0–36.0)
MCV: 92.5 fl (ref 78.0–100.0)
Monocytes Absolute: 0.6 10*3/uL (ref 0.1–1.0)
Monocytes Relative: 6.5 % (ref 3.0–12.0)
Neutro Abs: 5.9 10*3/uL (ref 1.4–7.7)
Neutrophils Relative %: 61.5 % (ref 43.0–77.0)
Platelets: 352 10*3/uL (ref 150.0–400.0)
RBC: 3.98 Mil/uL (ref 3.87–5.11)
RDW: 14.6 % (ref 11.5–15.5)
WBC: 9.6 10*3/uL (ref 4.0–10.5)

## 2020-12-19 LAB — HEPATIC FUNCTION PANEL
ALT: 12 U/L (ref 0–35)
AST: 18 U/L (ref 0–37)
Albumin: 4.4 g/dL (ref 3.5–5.2)
Alkaline Phosphatase: 62 U/L (ref 39–117)
Bilirubin, Direct: 0 mg/dL (ref 0.0–0.3)
Total Bilirubin: 0.3 mg/dL (ref 0.2–1.2)
Total Protein: 7 g/dL (ref 6.0–8.3)

## 2020-12-19 LAB — BASIC METABOLIC PANEL
BUN: 23 mg/dL (ref 6–23)
CO2: 23 mEq/L (ref 19–32)
Calcium: 9.4 mg/dL (ref 8.4–10.5)
Chloride: 105 mEq/L (ref 96–112)
Creatinine, Ser: 1.15 mg/dL (ref 0.40–1.20)
GFR: 50.54 mL/min — ABNORMAL LOW (ref >60.00)
Glucose, Bld: 66 mg/dL — ABNORMAL LOW (ref 70–99)
Potassium: 4.7 mEq/L (ref 3.5–5.1)
Sodium: 141 mEq/L (ref 135–145)

## 2020-12-19 LAB — LIPID PANEL
Cholesterol: 213 mg/dL — ABNORMAL HIGH (ref 0–200)
HDL: 70.9 mg/dL (ref >39.00)
LDL Cholesterol: 121 mg/dL — ABNORMAL HIGH (ref 0–99)
NonHDL: 142.27
Total CHOL/HDL Ratio: 3
Triglycerides: 108 mg/dL (ref 0.0–149.0)
VLDL: 21.6 mg/dL (ref 0.0–40.0)

## 2020-12-19 LAB — TSH: TSH: 1.94 u[IU]/mL (ref 0.35–4.50)

## 2020-12-30 ENCOUNTER — Encounter: Payer: Self-pay | Admitting: Nurse Practitioner

## 2021-01-09 ENCOUNTER — Other Ambulatory Visit: Payer: Self-pay

## 2021-01-09 ENCOUNTER — Ambulatory Visit (INDEPENDENT_AMBULATORY_CARE_PROVIDER_SITE_OTHER): Payer: BC Managed Care – PPO | Admitting: Family Medicine

## 2021-01-09 DIAGNOSIS — Z23 Encounter for immunization: Secondary | ICD-10-CM

## 2021-01-09 NOTE — Progress Notes (Signed)
Patient came in to get her second dose of the shingles vaccine. Patient

## 2021-01-29 ENCOUNTER — Encounter: Payer: Self-pay | Admitting: Nurse Practitioner

## 2021-01-29 ENCOUNTER — Ambulatory Visit: Payer: BC Managed Care – PPO | Admitting: Nurse Practitioner

## 2021-01-29 VITALS — BP 130/70 | HR 111 | Ht 67.0 in | Wt 169.0 lb

## 2021-01-29 DIAGNOSIS — R1310 Dysphagia, unspecified: Secondary | ICD-10-CM

## 2021-01-29 NOTE — Patient Instructions (Signed)
If you are age 64 or younger, your body mass index should be between 19-25. Your Body mass index is 26.47 kg/m. If this is out of the aformentioned range listed, please consider follow up with your Primary Care Provider.   The [facility] providers would like to encourage you to use [facility] to communicate with providers for non-urgent requests or questions.  Due to long hold times on the telephone, sending your provider a message by [facility] may be faster and more efficient way to get a response. Please allow 48 business hours for a response.  Please remember that this is for non-urgent requests/questions.  PROCEDURES: You have been scheduled for a EGD. Please follow the written instructions given to you at your visit today. If you use inhalers (even only as needed), please bring them with you on the day of your procedure.  It was great seeing you today! Thank you for entrusting me with your care and choosing [facility].  Willette Cluster, NP

## 2021-01-29 NOTE — Progress Notes (Signed)
ASSESSMENT AND PLAN    # 65 yo female with chronic , intermittent solid food dysphagia. Seeking evaluation after a couple of recent  "close calls" such as when an apple became lodged in her throat approximately 6 months ago.  Rule out esophageal stricture and/or esophageal dysmotility --We will schedule for EGD with possible dilation. Tthe risks and benefits of EGD with possible biopsies were discussed with the patient who agrees to proceed.  --Advised patient to eat small bites, chew well with liquids in between bites to avoid food impaction.  # IBS with chronic , intermittent diarrhea. BMs otherwise soft. Manages well with Imodium as needed. She gets associated lower abdominal cramping sometimes.  --Offered bowel antispasmodic but she declines for now.   --We discussed use of artifical sweeters and also dairy as possible culprits.   HISTORY OF PRESENT ILLNESS     Chief Complaint :  swallowing problems  Alison Anderson is a relatively healthy 65 y.o. female. She has a past medical history significant for IBS, cholecystectomy.  Patient is is referred by PCP for evaluation of dysphagia.  She is known to Dr. Lavon Paganini from a screening colonoscopy in 2018.  She is here with a several year history of solid food dysphagia.  Patient has not sought treatment until now.  She began having problems with dysphagia several years ago but episodes were more sporadic back then.  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  She has retired and now makes a point to eat slowly and chew well but certain foods still sometimes get stuck in her chest. She has now decided to get evaluation after a couple of "close calls", especially 6 months ago when an apple got lodged in her throat.  Bread and meat are also problematic.  She has no problems swallowing liquids . The dysphagia is more pronounced in the evening, maybe because breakfast foods are of a softer  consistency. .Patient rarely has heartburn.    Patient gives a history of IBS consisting of an episode of loose stool about every couple of weeks. Other times stools  are soft. She has intermittent lower abdominal cramping improved with bowel movements. She takes imodium as needed.  She feels like dairy and possibly artificial sweeteners contribute to her symptoms   PREVIOUS EVALUATIONS:   Screening colonoscopy in May 2018 --Normal except for hemorrhoids.    Past Medical History:  Diagnosis Date   Lumbar herniated disc    Menopause    at 32     Past Surgical History:  Procedure Laterality Date   ABDOMINAL HYSTERECTOMY  2012   back surgery  05/2016   herniated disc, pinched nerve    CHOLECYSTECTOMY     COLONOSCOPY     Family History  Problem Relation Age of Onset   Diabetes Mother    Heart disease Mother    Hypertension Mother    Diabetes Father        borderline   Cancer Maternal Grandmother        breast   Breast cancer Maternal Grandmother    Stroke Other        grandparent   Breast cancer Other        grandmother   Colon cancer Neg Hx    Colon polyps Neg Hx    Esophageal cancer Neg Hx    Rectal cancer Neg Hx    Stomach cancer Neg Hx  Social History   Tobacco Use   Smoking status: Never   Smokeless tobacco: Never  Substance Use Topics   Alcohol use: Yes    Alcohol/week: 0.0 standard drinks    Comment: rarely   Drug use: No   Current Outpatient Medications  Medication Sig Dispense Refill   acetaminophen (TYLENOL) 500 MG tablet Take 1,000 mg by mouth every 6 (six) hours as needed.     cyclobenzaprine (FLEXERIL) 10 MG tablet Take 1 tablet (10 mg total) by mouth every 8 (eight) hours as needed. 90 tablet 1   meloxicam (MOBIC) 15 MG tablet TAKE 1 TABLET BY MOUTH EVERY DAY 90 tablet 0   No current facility-administered medications for this visit.   Allergies  Allergen Reactions   Tramadol Itching and Rash     Review of Systems:  All systems  reviewed and negative except where noted in HPI.    PHYSICAL EXAM :    Wt Readings from Last 3 Encounters:  01/29/21 169 lb (76.7 kg)  12/15/20 167 lb (75.8 kg)  12/09/20 169 lb (76.7 kg)    BP 130/70   Pulse (!) 111   Ht 5\' 7"  (1.702 m)   Wt 169 lb (76.7 kg)   SpO2 95%   BMI 26.47 kg/m  Constitutional:  Pleasant female in no acute distress. Psychiatric: Normal mood and affect. Behavior is normal. EENT: Pupils normal.  Conjunctivae are normal. No scleral icterus. Neck supple.  Cardiovascular: Normal rate, regular rhythm. No edema Pulmonary/chest: Effort normal and breath sounds normal. No wheezing, rales or rhonchi. Abdominal: Soft, nondistended, nontender. Bowel sounds active throughout. There are no masses palpable. No hepatomegaly. Neurological: Alert and oriented to person place and time. Skin: Skin is warm and dry. No rashes noted.  , NP  01/29/2021, 11:43 AM  Cc:  Referring Provider 01/31/2021, MD

## 2021-02-01 ENCOUNTER — Other Ambulatory Visit: Payer: Self-pay | Admitting: Registered Nurse

## 2021-02-01 DIAGNOSIS — M62830 Muscle spasm of back: Secondary | ICD-10-CM

## 2021-02-02 NOTE — Telephone Encounter (Signed)
LFD 12/09/20 #90 with 1 refill LOV 01/09/21 NOV none

## 2021-02-10 ENCOUNTER — Encounter: Payer: Self-pay | Admitting: Certified Registered Nurse Anesthetist

## 2021-02-11 ENCOUNTER — Other Ambulatory Visit: Payer: Self-pay

## 2021-02-11 ENCOUNTER — Encounter: Payer: Self-pay | Admitting: Gastroenterology

## 2021-02-11 ENCOUNTER — Ambulatory Visit (AMBULATORY_SURGERY_CENTER): Payer: BC Managed Care – PPO | Admitting: Gastroenterology

## 2021-02-11 VITALS — BP 115/68 | HR 90 | Temp 97.5°F | Resp 16 | Ht 67.0 in | Wt 169.0 lb

## 2021-02-11 DIAGNOSIS — K222 Esophageal obstruction: Secondary | ICD-10-CM | POA: Diagnosis not present

## 2021-02-11 DIAGNOSIS — R1310 Dysphagia, unspecified: Secondary | ICD-10-CM

## 2021-02-11 DIAGNOSIS — K219 Gastro-esophageal reflux disease without esophagitis: Secondary | ICD-10-CM

## 2021-02-11 MED ORDER — PANTOPRAZOLE SODIUM 40 MG PO TBEC: 40.0000 mg | DELAYED_RELEASE_TABLET | Freq: Every day | ORAL | 0 refills | Status: DC

## 2021-02-11 MED ORDER — SODIUM CHLORIDE 0.9 % IV SOLN: 500.0000 mL | Freq: Once | INTRAVENOUS | Status: DC

## 2021-02-11 NOTE — Op Note (Signed)
[facility] Patient Name: Alison Anderson Procedure Date: 02/11/2021 8:27 AM MRN: [id number] Endoscopist: Napoleon Form , MD Age: 65 Referring MD:  Date of Birth: 1956/06/02 Gender: Female Account #: [id number] Procedure:                Upper GI endoscopy Indications:              Dysphagia Medicines:                Monitored Anesthesia Care Procedure:                Pre-Anesthesia Assessment:                           - Prior to the procedure, a History and Physical                            was performed, and patient medications and                            allergies were reviewed. The patient's tolerance of                            previous anesthesia was also reviewed. The risks                            and benefits of the procedure and the sedation                            options and risks were discussed with the patient.                            All questions were answered, and informed consent                            was obtained. Prior Anticoagulants: The patient has                            taken no previous anticoagulant or antiplatelet                            agents. ASA Grade Assessment: I - A normal, healthy                            patient. After reviewing the risks and benefits,                            the patient was deemed in satisfactory condition to                            undergo the procedure.                           After obtaining informed consent, the endoscope was  passed under direct vision. Throughout the                            procedure, the patient's blood pressure, pulse, and                            oxygen saturations were monitored continuously. The                            Endoscope was introduced through the mouth, and                            advanced to the second part of duodenum. The upper                            GI endoscopy was accomplished without difficulty.                             The patient tolerated the procedure well. Scope In: Scope Out: Findings:                 The Z-line was regular and was found 38 cm from the                            incisors.                           One benign-appearing, intrinsic mild stenosis was                            found 37 to 38 cm from the incisors. This stenosis                            measured less than one cm (in length). The stenosis                            was traversed. The scope was withdrawn. Dilation                            was performed with a Maloney dilator with no                            resistance at 50 Fr and mild resistance at 54 Fr.                            The dilation site was examined following endoscope                            reinsertion and showed mild mucosal disruption.                           Normal mucosa was found in the entire esophagus.  Biopsies were obtained from the proximal and distal                            esophagus with cold forceps for histology of                            suspected eosinophilic esophagitis.                           The stomach was normal.                           The cardia and gastric fundus were normal on                            retroflexion.                           The examined duodenum was normal. Complications:            No immediate complications. Estimated Blood Loss:     Estimated blood loss was minimal. Impression:               - Z-line regular, 38 cm from the incisors.                           - Benign-appearing esophageal stenosis. Dilated.                           - Normal mucosa was found in the entire esophagus.                            Biopsied.                           - Normal stomach.                           - Normal examined duodenum. Recommendation:           - Patient has a contact number available for                            emergencies. The signs and  symptoms of potential                            delayed complications were discussed with the                            patient. Return to normal activities tomorrow.                            Written discharge instructions were provided to the                            patient.                           -  Resume previous diet.                           - Continue present medications.                           - Await pathology results.                           - Follow an antireflux regimen.                           - Use Protonix (pantoprazole) 40 mg PO daily for 3                            months. Napoleon FormKavitha V. Jalyssa Fleisher, MD 02/11/2021 8:56:53 AM This report has been signed electronically.

## 2021-02-11 NOTE — Progress Notes (Signed)
Called to room to assist during endoscopic procedure.  Patient ID and intended procedure confirmed with present staff. Received instructions for my participation in the procedure from the performing physician.  

## 2021-02-11 NOTE — Progress Notes (Signed)
0838 HR > 100 with esmolol 25 mg given IV, MD updated, vss  

## 2021-02-11 NOTE — Progress Notes (Signed)
Medical history reviewed with no changes noted. VS assessed by C.W 

## 2021-02-11 NOTE — Patient Instructions (Signed)
Thank you for allowing [place] to care for you today! Follow soft diet today, handout provided. Prescription for Protonix 40 mg daily for 3 months. Follow anti-reflux measures, handout given.    YOU HAD AN ENDOSCOPIC PROCEDURE TODAY AT THE [facility]:   Refer to the procedure report that was given to you for any specific questions about what was found during the examination.  If the procedure report does not answer your questions, please call your gastroenterologist to clarify.  If you requested that your care partner not be given the details of your procedure findings, then the procedure report has been included in a sealed envelope for you to review at your convenience later.  YOU SHOULD EXPECT: Some feelings of bloating in the abdomen. Passage of more gas than usual.  Walking can help get rid of the air that was put into your GI tract during the procedure and reduce the bloating. If you had a lower endoscopy (such as a colonoscopy or flexible sigmoidoscopy) you may notice spotting of blood in your stool or on the toilet paper. If you underwent a bowel prep for your procedure, you may not have a normal bowel movement for a few days.  Please Note:  You might notice some irritation and congestion in your nose or some drainage.  This is from the oxygen used during your procedure.  There is no need for concern and it should clear up in a day or so.  SYMPTOMS TO REPORT IMMEDIATELY:    Following upper endoscopy (EGD)  Vomiting of blood or coffee ground material  New chest pain or pain under the shoulder blades  Painful or persistently difficult swallowing  New shortness of breath  Fever of 100F or higher  Black, tarry-looking stools  For urgent or emergent issues, a gastroenterologist can be reached at any hour by calling [phone]. Do not use MyChart messaging for urgent concerns.    DIET:  We do recommend a small meal at first, but then you may proceed to your regular diet.   Drink plenty of fluids but you should avoid alcoholic beverages for 24 hours.  ACTIVITY:  You should plan to take it easy for the rest of today and you should NOT DRIVE or use heavy machinery until tomorrow (because of the sedation medicines used during the test).    FOLLOW UP: Our staff will call the number listed on your records 48-72 hours following your procedure to check on you and address any questions or concerns that you may have regarding the information given to you following your procedure. If we do not reach you, we will leave a message.  We will attempt to reach you two times.  During this call, we will ask if you have developed any symptoms of COVID 19. If you develop any symptoms (ie: fever, flu-like symptoms, shortness of breath, cough etc.) before then, please call [phone].  If you test positive for Covid 19 in the 2 weeks post procedure, please call and report this information to [place].    If any biopsies were taken you will be contacted by phone or by letter within the next 1-3 weeks.  Please call us at [phone] if you have not heard about the biopsies in 3 weeks.    SIGNATURES/CONFIDENTIALITY: You and/or your care partner have signed paperwork which will be entered into your electronic medical record.  These signatures attest to the fact that that the information above on your After Visit Summary has been  reviewed and is understood.  Full responsibility of the confidentiality of this discharge information lies with you and/or your care-partner.

## 2021-02-11 NOTE — Progress Notes (Signed)
0829 Robinul 0.1 mg IV given due large amount of secretions upon assessment.  MD made aware, vss  

## 2021-02-13 ENCOUNTER — Telehealth: Payer: Self-pay | Admitting: *Deleted

## 2021-02-13 ENCOUNTER — Telehealth: Payer: Self-pay

## 2021-02-13 NOTE — Telephone Encounter (Signed)
  Follow up Call-  Call back number 02/11/2021  Post procedure Call Back phone  # [phone]  Permission to leave phone message Yes  Some recent data might be hidden     Patient questions:  Do you have a fever, pain , or abdominal swelling? No. Pain Score  0 *  Have you tolerated food without any problems? Yes.    Have you been able to return to your normal activities? Yes.    Do you have any questions about your discharge instructions: Diet   No. Medications  No. Follow up visit  No.  Do you have questions or concerns about your Care? No.  Actions: * If pain score is 4 or above: No action needed, pain <4.  Have you developed a fever since your procedure? no  2.   Have you had an respiratory symptoms (SOB or cough) since your procedure? no  3.   Have you tested positive for COVID 19 since your procedure no  4.   Have you had any family members/close contacts diagnosed with the COVID 19 since your procedure?  no   If yes to any of these questions please route to Laverna Peace, RN and Karlton Lemon, RN

## 2021-02-13 NOTE — Telephone Encounter (Signed)
First post procedure follow up call, no answer 

## 2021-03-03 ENCOUNTER — Encounter: Payer: Self-pay | Admitting: Gastroenterology

## 2021-03-09 NOTE — Progress Notes (Signed)
Reviewed and agree with documentation and assessment and plan. K. Veena Burnell Matlin , MD   

## 2021-04-01 ENCOUNTER — Other Ambulatory Visit: Payer: Self-pay

## 2021-04-01 ENCOUNTER — Encounter: Payer: Self-pay | Admitting: Registered Nurse

## 2021-04-01 ENCOUNTER — Ambulatory Visit: Payer: BC Managed Care – PPO | Admitting: Registered Nurse

## 2021-04-01 VITALS — BP 120/66 | HR 96 | Temp 97.9°F | Ht 67.0 in | Wt 170.0 lb

## 2021-04-01 DIAGNOSIS — N390 Urinary tract infection, site not specified: Secondary | ICD-10-CM | POA: Diagnosis not present

## 2021-04-01 LAB — POCT URINALYSIS DIP (MANUAL ENTRY)
Bilirubin, UA: NEGATIVE
Glucose, UA: NEGATIVE mg/dL
Ketones, POC UA: NEGATIVE mg/dL
Nitrite, UA: POSITIVE — AB
Spec Grav, UA: 1.02 (ref 1.010–1.025)
Urobilinogen, UA: 0.2 E.U./dL
pH, UA: 5 (ref 5.0–8.0)

## 2021-04-01 MED ORDER — SULFAMETHOXAZOLE-TRIMETHOPRIM 800-160 MG PO TABS: 1.0000 | ORAL_TABLET | Freq: Two times a day (BID) | ORAL | 0 refills | Status: DC

## 2021-04-01 NOTE — Patient Instructions (Addendum)
Ms. Chaunice Obie to see you! Sorry it's under these circumstances.  Sending bactrim take as directed unless I call to tell otherwise.  Check out d-mannose. I have attached info. Might be worth trying for early treatment - seems like its a "low risk, potentially high reward" kind of thing.  Thank you   Rich        "D-mannose -- We do not routinely suggest D-mannose, a natural sugar available in health food stores and online, for prevention of cystitis given the lack of convincing clinical efficacy. However, the drawbacks are likely limited, and we do not discourage its use in females who are interested in trying it as an antimicrobial-sparing strategy. We discuss with patients, as appropriate, the biological rationale for its use and the uncertainty around the clinical effect. The optimal dose is also uncertain.  D-mannose is an example of a compound that mimics the host uroepithelial receptors used by uropathogens. Such compounds are hypothesized to competitively bind to bacterial surface ligands, decrease the number of bacteria attaching to the mucosa, and alter the delicate balance of host-bacterial interaction in favor of the host [43,62].  However, the published clinical evidence on the effectiveness of D-mannose for preventing cystitis is spare and of low quality [63]. Furthermore, it is not known what urinary levels of D-mannose might be protective and whether oral administration of D-mannose can achieve such levels with the doses recommended by the manufacturers.  Studies are underway to identify related compounds that are well absorbed and have high affinity for the bacterial surface ligand."    If you have lab work done today you will be contacted with your lab results within the next 2 weeks.  If you have not heard from [place] then please contact us. The fastest way to get your results is to register for My Chart.   IF you received an x-ray today, you will receive an invoice  from [facility]. Please contact [facility] at [phone] with questions or concerns regarding your invoice.   IF you received labwork today, you will receive an invoice from [place]. Please contact [facility] at [phone] with questions or concerns regarding your invoice.   Our billing staff will not be able to assist you with questions regarding bills from these companies.  You will be contacted with the lab results as soon as they are available. The fastest way to get your results is to activate your My Chart account. Instructions are located on the last page of this paperwork. If you have not heard from [place] regarding the results in 2 weeks, please contact this office.

## 2021-04-02 ENCOUNTER — Other Ambulatory Visit: Payer: Self-pay | Admitting: Family

## 2021-04-02 DIAGNOSIS — M62830 Muscle spasm of back: Secondary | ICD-10-CM

## 2021-04-03 LAB — URINE CULTURE
MICRO NUMBER:: 12404541
SPECIMEN QUALITY:: ADEQUATE

## 2021-04-03 NOTE — Progress Notes (Signed)
Established Patient Office Visit  Subjective:  Patient ID: Alison Anderson, female    DOB: 11-27-1955  Age: 65 y.o. MRN: [id number]  CC:  Chief Complaint  Patient presents with   Urinary Frequency    Patient states she has been having some frequent urination. Patient has felt some pressure and has been going for about 3 days.    HPI Alison Anderson presents for urinary frequency Acute onset 3 days ago Worsening No constitutional symptoms No gross hematuria, flank pain, or urinary hesitancy.  Has had UTI before, feels very similar.  Otherwise no concerns.   Past Medical History:  Diagnosis Date   Allergy    Lumbar herniated disc    Menopause    at 64    Past Surgical History:  Procedure Laterality Date   ABDOMINAL HYSTERECTOMY  2012   back surgery  05/2016   herniated disc, pinched nerve    CHOLECYSTECTOMY     COLONOSCOPY      Family History  Problem Relation Age of Onset   Diabetes Mother    Heart disease Mother    Hypertension Mother    Diabetes Father        borderline   Cancer Maternal Grandmother        breast   Breast cancer Maternal Grandmother    Stroke Other        grandparent   Breast cancer Other        grandmother   Colon cancer Neg Hx    Colon polyps Neg Hx    Esophageal cancer Neg Hx    Rectal cancer Neg Hx    Stomach cancer Neg Hx     Social History   Socioeconomic History   Marital status: Married    Spouse name: Not on file   Number of children: Not on file   Years of education: Not on file   Highest education level: Not on file  Occupational History   Not on file  Tobacco Use   Smoking status: Never   Smokeless tobacco: Never  Substance and Sexual Activity   Alcohol use: Yes    Alcohol/week: 0.0 standard drinks    Comment: rarely   Drug use: No   Sexual activity: Not Currently  Other Topics Concern   Not on file  Social History Narrative   Lives with husband and daughter Baxter Hire   Works as a Midwife at  [employer] Strain: Not on [employer] Insecurity: Not on Chartered certified accountant Needs: Not on file  Physical Activity: Not on file  Stress: Not on file  Social Connections: Not on file  Intimate Partner Violence: Not on file    Outpatient Medications Prior to Visit  Medication Sig Dispense Refill   meloxicam (MOBIC) 15 MG tablet TAKE 1 TABLET BY MOUTH EVERY DAY 90 tablet 0   pantoprazole (PROTONIX) 40 MG tablet Take 1 tablet (40 mg total) by mouth daily. 90 tablet 0   cyclobenzaprine (FLEXERIL) 10 MG tablet TAKE 1 TABLET BY MOUTH EVERY 8 HOURS AS NEEDED. 90 tablet 1   acetaminophen (TYLENOL) 500 MG tablet Take 1,000 mg by mouth every 6 (six) hours as needed. (Patient not taking: Reported on 04/01/2021)     No facility-administered medications prior to visit.    Allergies  Allergen Reactions   Tramadol Itching and Rash    ROS Review of Systems  Constitutional: Negative.   HENT: Negative.  Eyes: Negative.   Respiratory: Negative.    Cardiovascular: Negative.   Gastrointestinal: Negative.   Genitourinary: Negative.   Musculoskeletal: Negative.   Skin: Negative.   Neurological: Negative.   Psychiatric/Behavioral: Negative.    All other systems reviewed and are negative.    Objective:    Physical Exam Vitals and nursing note reviewed.  Constitutional:      General: She is not in acute distress.    Appearance: Normal appearance. She is normal weight. She is not ill-appearing, toxic-appearing or diaphoretic.  Cardiovascular:     Rate and Rhythm: Normal rate and regular rhythm.     Heart sounds: Normal heart sounds. No murmur heard.   No friction rub. No gallop.  Pulmonary:     Effort: Pulmonary effort is normal. No respiratory distress.     Breath sounds: Normal breath sounds. No stridor. No wheezing, rhonchi or rales.  Chest:     Chest wall: No tenderness.  Skin:    General: Skin is warm and dry.  Neurological:      General: No focal deficit present.     Mental Status: She is alert and oriented to person, place, and time. Mental status is at baseline.  Psychiatric:        Mood and Affect: Mood normal.        Behavior: Behavior normal.        Thought Content: Thought content normal.        Judgment: Judgment normal.    BP 120/66   Pulse 96   Temp 97.9 F (36.6 C) (Temporal)   Ht 5\' 7"  (1.702 m)   Wt 170 lb (77.1 kg)   SpO2 99%   BMI 26.63 kg/m  Wt Readings from Last 3 Encounters:  04/01/21 170 lb (77.1 kg)  02/11/21 169 lb (76.7 kg)  01/29/21 169 lb (76.7 kg)     Health Maintenance Due  Topic Date Due   MAMMOGRAM  08/28/2020    There are no preventive care reminders to display for this patient.  Lab Results  Component Value Date   TSH 1.94 12/19/2020   Lab Results  Component Value Date   WBC 9.6 12/19/2020   HGB 12.0 12/19/2020   HCT 36.8 12/19/2020   MCV 92.5 12/19/2020   PLT 352.0 12/19/2020   Lab Results  Component Value Date   NA 141 12/19/2020   K 4.7 12/19/2020   CO2 23 12/19/2020   GLUCOSE 66 (L) 12/19/2020   BUN 23 12/19/2020   CREATININE 1.15 12/19/2020   BILITOT 0.3 12/19/2020   ALKPHOS 62 12/19/2020   AST 18 12/19/2020   ALT 12 12/19/2020   PROT 7.0 12/19/2020   ALBUMIN 4.4 12/19/2020   CALCIUM 9.4 12/19/2020   GFR 50.54 (L) 12/19/2020   Lab Results  Component Value Date   CHOL 213 (H) 12/19/2020   Lab Results  Component Value Date   HDL 70.90 12/19/2020   Lab Results  Component Value Date   LDLCALC 121 (H) 12/19/2020   Lab Results  Component Value Date   TRIG 108.0 12/19/2020   Lab Results  Component Value Date   CHOLHDL 3 12/19/2020   No results found for: HGBA1C    Assessment & Plan:   Problem List Items Addressed This Visit   None Visit Diagnoses     Urinary tract infection without hematuria, site unspecified    -  Primary   Relevant Medications   sulfamethoxazole-trimethoprim (BACTRIM DS) 800-160 MG tablet   Other  Relevant  Orders   POCT urinalysis dipstick (Completed)   Urine Culture       Meds ordered this encounter  Medications   sulfamethoxazole-trimethoprim (BACTRIM DS) 800-160 MG tablet    Sig: Take 1 tablet by mouth 2 (two) times daily.    Dispense:  6 tablet    Refill:  0    Order Specific Question:   Supervising Provider    Answer:   Neva Seat, JEFFREY R [[id number]]    Follow-up: Return if symptoms worsen or fail to improve.   PLAN Based on poct UA findings, will treat presumptively. Send out culture. Adjust treatment as warranted Discussed supportive care and preventative measures. Patient encouraged to call clinic with any questions, comments, or concerns.  Janeece Agee, NP

## 2021-04-06 ENCOUNTER — Other Ambulatory Visit: Payer: Self-pay | Admitting: Registered Nurse

## 2021-04-06 ENCOUNTER — Telehealth: Payer: Self-pay | Admitting: Family Medicine

## 2021-04-06 DIAGNOSIS — N390 Urinary tract infection, site not specified: Secondary | ICD-10-CM

## 2021-04-06 MED ORDER — NITROFURANTOIN MONOHYD MACRO 100 MG PO CAPS: 100.0000 mg | ORAL_CAPSULE | Freq: Two times a day (BID) | ORAL | 0 refills | Status: AC

## 2021-04-06 NOTE — Telephone Encounter (Signed)
Patient called again regarding medication to see if she would get more antibiotics for her UTI  Please advise

## 2021-04-06 NOTE — Progress Notes (Signed)
Can call patient -  If she is still having symptoms, can call in alternative. But culture indicates treatment should be effective.  Thanks,  Luan Pulling

## 2021-04-06 NOTE — Progress Notes (Signed)
UTI symptoms persist. Will send macrobid 100mg  po bid  If no improvement will need OV, or can consider urology referral.  , NP

## 2021-04-06 NOTE — Telephone Encounter (Signed)
Patient was only given 6 days worth of antibiotic for a UTI - she feels like she needs more antibiotic because it's better, just not cleared up.  Please advise.

## 2021-04-06 NOTE — Telephone Encounter (Signed)
Have called and spoken to patient about concerns.

## 2021-04-07 ENCOUNTER — Other Ambulatory Visit: Payer: Self-pay

## 2021-04-07 ENCOUNTER — Ambulatory Visit
Admission: RE | Admit: 2021-04-07 | Discharge: 2021-04-07 | Disposition: A | Discharge status: Home / Self Care | Payer: BC Managed Care – PPO | Source: Ambulatory Visit | Attending: Family Medicine | Admitting: Family Medicine

## 2021-04-07 DIAGNOSIS — Z1231 Encounter for screening mammogram for malignant neoplasm of breast: Secondary | ICD-10-CM

## 2021-04-07 NOTE — Telephone Encounter (Signed)
Medication was sent in yesterday

## 2021-04-27 ENCOUNTER — Other Ambulatory Visit: Payer: Self-pay | Admitting: Family Medicine

## 2021-05-09 ENCOUNTER — Other Ambulatory Visit: Payer: Self-pay | Admitting: Gastroenterology

## 2021-05-09 DIAGNOSIS — R1310 Dysphagia, unspecified: Secondary | ICD-10-CM

## 2021-05-09 DIAGNOSIS — K219 Gastro-esophageal reflux disease without esophagitis: Secondary | ICD-10-CM

## 2021-05-11 ENCOUNTER — Telehealth: Payer: Self-pay | Admitting: Gastroenterology

## 2021-05-11 NOTE — Telephone Encounter (Signed)
By the endoscopy report it shows to take protonix for 3 months  Dr Lavon Paganini do you only want her on protonix for 3 months as the EGD report says ?

## 2021-05-11 NOTE — Telephone Encounter (Signed)
Please send a refill and advise patient to use it every other day for a week and then slowly taper it off. Schedule follow up office visit next available. Thanks

## 2021-05-11 NOTE — Telephone Encounter (Signed)
Inbound call from pt requesting a call back stating that the pharmacy wants to refill her Pantoprazole and she wants to know if should continue to take this medication. Please advise. Thank you.

## 2021-05-12 NOTE — Telephone Encounter (Signed)
Patient made an appointment to be seen Jan 10th to discuss Protonix. She will start taking it other day until she is seen in the office

## 2021-05-14 ENCOUNTER — Ambulatory Visit: Payer: BC Managed Care – PPO | Admitting: Family Medicine

## 2021-05-14 ENCOUNTER — Other Ambulatory Visit: Payer: Self-pay

## 2021-05-14 VITALS — BP 128/76 | HR 108 | Temp 97.8°F | Resp 16 | Ht 67.0 in | Wt 166.5 lb

## 2021-05-14 DIAGNOSIS — R350 Frequency of micturition: Secondary | ICD-10-CM | POA: Diagnosis not present

## 2021-05-14 DIAGNOSIS — N3001 Acute cystitis with hematuria: Secondary | ICD-10-CM

## 2021-05-14 LAB — POCT URINALYSIS DIP (MANUAL ENTRY)
Bilirubin, UA: NEGATIVE
Glucose, UA: NEGATIVE mg/dL
Ketones, POC UA: NEGATIVE mg/dL
Nitrite, UA: NEGATIVE
Protein Ur, POC: NEGATIVE mg/dL
Spec Grav, UA: 1.015 (ref 1.010–1.025)
Urobilinogen, UA: 0.2 E.U./dL
pH, UA: 6 (ref 5.0–8.0)

## 2021-05-14 MED ORDER — SULFAMETHOXAZOLE-TRIMETHOPRIM 800-160 MG PO TABS: 1.0000 | ORAL_TABLET | Freq: Two times a day (BID) | ORAL | 0 refills | Status: DC

## 2021-05-14 NOTE — Patient Instructions (Signed)
I will check a urine culture today to make sure we are treating the right infection with the right antibiotic.  If symptoms do not improve with this course of antibiotics, or any worsening symptoms, please return for recheck.  I would discuss with your primary care provider if any further testing or urology evaluation would be needed given your recent infections.  Again I agree with treating the current infection but if these are becoming more frequent may need to look into underlying causes.  Okay to take cranberry supplement over-the-counter.  Thank you for coming in today and please let us know if there are questions.  Urinary Tract Infection, Adult A urinary tract infection (UTI) is an infection of any part of the urinary tract. The urinary tract includes the kidneys, ureters, bladder, and urethra. These organs make, store, and get rid of urine in the body. An upper UTI affects the ureters and kidneys. A lower UTI affects the bladder and urethra. What are the causes? Most urinary tract infections are caused by bacteria in your genital area around your urethra, where urine leaves your body. These bacteria grow and cause inflammation of your urinary tract. What increases the risk? You are more likely to develop this condition if: You have a urinary catheter that stays in place. You are not able to control when you urinate or have a bowel movement (incontinence). You are female and you: Use a spermicide or diaphragm for birth control. Have low estrogen levels. Are pregnant. You have certain genes that increase your risk. You are sexually active. You take antibiotic medicines. You have a condition that causes your flow of urine to slow down, such as: An enlarged prostate, if you are female. Blockage in your urethra. A kidney stone. A nerve condition that affects your bladder control (neurogenic bladder). Not getting enough to drink, or not urinating often. You have certain medical conditions,  such as: Diabetes. A weak disease-fighting system (immunesystem). Sickle cell disease. Gout. Spinal cord injury. What are the signs or symptoms? Symptoms of this condition include: Needing to urinate right away (urgency). Frequent urination. This may include small amounts of urine each time you urinate. Pain or burning with urination. Blood in the urine. Urine that smells bad or unusual. Trouble urinating. Cloudy urine. Vaginal discharge, if you are female. Pain in the abdomen or the lower back. You may also have: Vomiting or a decreased appetite. Confusion. Irritability or tiredness. A fever or chills. Diarrhea. The first symptom in older adults may be confusion. In some cases, they may not have any symptoms until the infection has worsened. How is this diagnosed? This condition is diagnosed based on your medical history and a physical exam. You may also have other tests, including: Urine tests. Blood tests. Tests for STIs (sexually transmitted infections). If you have had more than one UTI, a cystoscopy or imaging studies may be done to determine the cause of the infections. How is this treated? Treatment for this condition includes: Antibiotic medicine. Over-the-counter medicines to treat discomfort. Drinking enough water to stay hydrated. If you have frequent infections or have other conditions such as a kidney stone, you may need to see a health care provider who specializes in the urinary tract (urologist). In rare cases, urinary tract infections can cause sepsis. Sepsis is a life-threatening condition that occurs when the body responds to an infection. Sepsis is treated in the hospital with IV antibiotics, fluids, and other medicines. Follow these instructions at home: Medicines Take over-the-counter and prescription  medicines only as told by your health care provider. If you were prescribed an antibiotic medicine, take it as told by your health care provider. Do not  stop using the antibiotic even if you start to feel better. General instructions Make sure you: Empty your bladder often and completely. Do not hold urine for long periods of time. Empty your bladder after sex. Wipe from front to back after urinating or having a bowel movement if you are female. Use each tissue only one time when you wipe. Drink enough fluid to keep your urine pale yellow. Keep all follow-up visits. This is important. Contact a health care provider if: Your symptoms do not get better after 1-2 days. Your symptoms go away and then return. Get help right away if: You have severe pain in your back or your lower abdomen. You have a fever or chills. You have nausea or vomiting. Summary A urinary tract infection (UTI) is an infection of any part of the urinary tract, which includes the kidneys, ureters, bladder, and urethra. Most urinary tract infections are caused by bacteria in your genital area. Treatment for this condition often includes antibiotic medicines. If you were prescribed an antibiotic medicine, take it as told by your health care provider. Do not stop using the antibiotic even if you start to feel better. Keep all follow-up visits. This is important. This information is not intended to replace advice given to you by your health care provider. Make sure you discuss any questions you have with your health care provider. Document Revised: 02/08/2020 Document Reviewed: 02/08/2020 Elsevier Patient Education  2022 [employer].

## 2021-05-14 NOTE — Progress Notes (Signed)
Subjective:  Patient ID: Alison Anderson, female    DOB: 11-25-1955  Age: 65 y.o. MRN: [id number]  CC:  Chief Complaint  Patient presents with   Urinary Frequency    Pt reports frequency, urgency, incontinence, tried uricalm which has helped but needs this to resolve.     HPI Alison Anderson presents for   Urinary frequency: Hx of distended bladder, frequent UTI in past.  Last treated UTI 04/01/21. E. Coli in culture, prior in May, then September last year.   Started 1 week ago - dysuria, urgency, frequent urination. No hematuria.  No new back pain, n/v.  No fever.  About the same. Drinking fluids.   Tx: uricalm otc few days ago. No recent cranberry - does not like drinking it.        History Patient Active Problem List   Diagnosis Date Noted   Overweight (BMI 25.0-29.9) 12/25/2018   Low back pain with right-sided sciatica 12/09/2015   Physical exam 06/05/2014   Lumbar paraspinal muscle spasm 06/15/2013   Past Medical History:  Diagnosis Date   Allergy    Lumbar herniated disc    Menopause    at 48   Past Surgical History:  Procedure Laterality Date   ABDOMINAL HYSTERECTOMY  2012   back surgery  05/2016   herniated disc, pinched nerve    CHOLECYSTECTOMY     COLONOSCOPY     Allergies  Allergen Reactions   Tramadol Itching and Rash   Prior to Admission medications   Medication Sig Start Date End Date Taking? Authorizing Provider  cyclobenzaprine (FLEXERIL) 10 MG tablet TAKE 1 TABLET BY MOUTH EVERY 8 HOURS AS NEEDED 04/02/21  Yes Sheliah Hatch, MD  meloxicam (MOBIC) 15 MG tablet TAKE 1 TABLET BY MOUTH EVERY DAY 04/27/21  Yes Sheliah Hatch, MD  pantoprazole (PROTONIX) 40 MG tablet TAKE 1 TABLET BY MOUTH EVERY DAY 05/11/21  Yes Nandigam, Eleonore Chiquito, MD   Social History   Socioeconomic History   Marital status: Married    Spouse name: Not on file   Number of children: Not on file   Years of education: Not on file   Highest education level: Not  on file  Occupational History   Not on file  Tobacco Use   Smoking status: Never   Smokeless tobacco: Never  Substance and Sexual Activity   Alcohol use: Yes    Alcohol/week: 0.0 standard drinks    Comment: rarely   Drug use: No   Sexual activity: Not Currently  Other Topics Concern   Not on file  Social History Narrative   Lives with husband and daughter Baxter Hire   Works as a Midwife at [employer] Strain: Not on [employer] Insecurity: Not on file  Transportation Needs: Not on file  Physical Activity: Not on file  Stress: Not on file  Social Connections: Not on file  Intimate Partner Violence: Not on file    Review of Systems Per HPI.   Objective:   Vitals:   05/14/21 1137  BP: 128/76  Pulse: (!) 108  Resp: 16  Temp: 97.8 F (36.6 C)  TempSrc: Temporal  SpO2: 97%  Weight: 166 lb 8 oz (75.5 kg)  Height: 5\' 7"  (1.702 m)     Physical Exam Constitutional:      Appearance: Normal appearance. She is well-developed.  HENT:     Head: Normocephalic and atraumatic.  Pulmonary:  Effort: Pulmonary effort is normal.  Abdominal:     General: There is no distension.     Palpations: Abdomen is soft.     Tenderness: There is no abdominal tenderness. There is no guarding or rebound.  Skin:    General: Skin is warm.  Neurological:     Mental Status: She is alert and oriented to person, place, and time.  Psychiatric:        Behavior: Behavior normal.     Results for orders placed or performed in visit on 05/14/21  POCT urinalysis dipstick  Result Value Ref Range   Color, UA yellow yellow   Clarity, UA clear clear   Glucose, UA negative negative mg/dL   Bilirubin, UA negative negative   Ketones, POC UA negative negative mg/dL   Spec Grav, UA 1.015 1.010 - 1.025   Blood, UA moderate (A) negative   pH, UA 6.0 5.0 - 8.0   Protein Ur, POC negative negative mg/dL   Urobilinogen, UA 0.2 0.2 or 1.0  E.U./dL   Nitrite, UA Negative Negative   Leukocytes, UA Trace (A) Negative     Assessment & Plan:  Maranda Reily is a 65 y.o. female . Frequent urination - Plan: POCT urinalysis dipstick, Urine Culture, sulfamethoxazole-trimethoprim (BACTRIM DS) 800-160 MG tablet  Acute cystitis with hematuria - Plan: Urine Culture, sulfamethoxazole-trimethoprim (BACTRIM DS) 800-160 MG tablet Acute cystitis likely by symptoms and in office testing, check culture. With recent infection will treat with 7-day course of Bactrim, option of cranberry tablets in the future.  Also recommended discussing with PCP given quick recurrence and potential recurrent UTI.  RTC precautions given if worsening or incomplete resolution of symptoms.  Meds ordered this encounter  Medications   sulfamethoxazole-trimethoprim (BACTRIM DS) 800-160 MG tablet    Sig: Take 1 tablet by mouth 2 (two) times daily.    Dispense:  14 tablet    Refill:  0   Patient Instructions  I will check a urine culture today to make sure we are treating the right infection with the right antibiotic.  If symptoms do not improve with this course of antibiotics, or any worsening symptoms, please return for recheck.  I would discuss with your primary care provider if any further testing or urology evaluation would be needed given your recent infections.  Again I agree with treating the current infection but if these are becoming more frequent may need to look into underlying causes.  Okay to take cranberry supplement over-the-counter.  Thank you for coming in today and please let us know if there are questions.  Urinary Tract Infection, Adult A urinary tract infection (UTI) is an infection of any part of the urinary tract. The urinary tract includes the kidneys, ureters, bladder, and urethra. These organs make, store, and get rid of urine in the body. An upper UTI affects the ureters and kidneys. A lower UTI affects the bladder and urethra. What are the  causes? Most urinary tract infections are caused by bacteria in your genital area around your urethra, where urine leaves your body. These bacteria grow and cause inflammation of your urinary tract. What increases the risk? You are more likely to develop this condition if: You have a urinary catheter that stays in place. You are not able to control when you urinate or have a bowel movement (incontinence). You are female and you: Use a spermicide or diaphragm for birth control. Have low estrogen levels. Are pregnant. You have certain genes that increase your risk.  You are sexually active. You take antibiotic medicines. You have a condition that causes your flow of urine to slow down, such as: An enlarged prostate, if you are female. Blockage in your urethra. A kidney stone. A nerve condition that affects your bladder control (neurogenic bladder). Not getting enough to drink, or not urinating often. You have certain medical conditions, such as: Diabetes. A weak disease-fighting system (immunesystem). Sickle cell disease. Gout. Spinal cord injury. What are the signs or symptoms? Symptoms of this condition include: Needing to urinate right away (urgency). Frequent urination. This may include small amounts of urine each time you urinate. Pain or burning with urination. Blood in the urine. Urine that smells bad or unusual. Trouble urinating. Cloudy urine. Vaginal discharge, if you are female. Pain in the abdomen or the lower back. You may also have: Vomiting or a decreased appetite. Confusion. Irritability or tiredness. A fever or chills. Diarrhea. The first symptom in older adults may be confusion. In some cases, they may not have any symptoms until the infection has worsened. How is this diagnosed? This condition is diagnosed based on your medical history and a physical exam. You may also have other tests, including: Urine tests. Blood tests. Tests for STIs (sexually  transmitted infections). If you have had more than one UTI, a cystoscopy or imaging studies may be done to determine the cause of the infections. How is this treated? Treatment for this condition includes: Antibiotic medicine. Over-the-counter medicines to treat discomfort. Drinking enough water to stay hydrated. If you have frequent infections or have other conditions such as a kidney stone, you may need to see a health care provider who specializes in the urinary tract (urologist). In rare cases, urinary tract infections can cause sepsis. Sepsis is a life-threatening condition that occurs when the body responds to an infection. Sepsis is treated in the hospital with IV antibiotics, fluids, and other medicines. Follow these instructions at home: Medicines Take over-the-counter and prescription medicines only as told by your health care provider. If you were prescribed an antibiotic medicine, take it as told by your health care provider. Do not stop using the antibiotic even if you start to feel better. General instructions Make sure you: Empty your bladder often and completely. Do not hold urine for long periods of time. Empty your bladder after sex. Wipe from front to back after urinating or having a bowel movement if you are female. Use each tissue only one time when you wipe. Drink enough fluid to keep your urine pale yellow. Keep all follow-up visits. This is important. Contact a health care provider if: Your symptoms do not get better after 1-2 days. Your symptoms go away and then return. Get help right away if: You have severe pain in your back or your lower abdomen. You have a fever or chills. You have nausea or vomiting. Summary A urinary tract infection (UTI) is an infection of any part of the urinary tract, which includes the kidneys, ureters, bladder, and urethra. Most urinary tract infections are caused by bacteria in your genital area. Treatment for this condition often  includes antibiotic medicines. If you were prescribed an antibiotic medicine, take it as told by your health care provider. Do not stop using the antibiotic even if you start to feel better. Keep all follow-up visits. This is important. This information is not intended to replace advice given to you by your health care provider. Make sure you discuss any questions you have with your health care provider. Document  Revised: 02/08/2020 Document Reviewed: 02/08/2020 Elsevier Patient Education  2022 [employer],   Merri Ray, MD [place] 05/14/21 12:20 PM

## 2021-05-14 NOTE — Addendum Note (Signed)
Addended by: Elita Boone E on: 05/14/2021 01:26 PM   Modules accepted: Orders

## 2021-05-16 LAB — URINE CULTURE
MICRO NUMBER:: 12592691
SPECIMEN QUALITY:: ADEQUATE

## 2021-05-29 ENCOUNTER — Other Ambulatory Visit: Payer: Self-pay | Admitting: Family Medicine

## 2021-05-29 DIAGNOSIS — M62830 Muscle spasm of back: Secondary | ICD-10-CM

## 2021-07-21 ENCOUNTER — Encounter: Payer: Self-pay | Admitting: Gastroenterology

## 2021-07-21 ENCOUNTER — Ambulatory Visit: Payer: BC Managed Care – PPO | Admitting: Gastroenterology

## 2021-07-21 VITALS — BP 134/70 | HR 120 | Ht 66.0 in | Wt 161.0 lb

## 2021-07-21 DIAGNOSIS — K222 Esophageal obstruction: Secondary | ICD-10-CM | POA: Diagnosis not present

## 2021-07-21 DIAGNOSIS — K580 Irritable bowel syndrome with diarrhea: Secondary | ICD-10-CM | POA: Diagnosis not present

## 2021-07-21 DIAGNOSIS — K219 Gastro-esophageal reflux disease without esophagitis: Secondary | ICD-10-CM | POA: Diagnosis not present

## 2021-07-21 MED ORDER — PANTOPRAZOLE SODIUM 20 MG PO TBEC: 20.0000 mg | DELAYED_RELEASE_TABLET | Freq: Every day | ORAL | 3 refills | Status: DC

## 2021-07-21 MED ORDER — PANTOPRAZOLE SODIUM 20 MG PO TBEC: 20.0000 mg | DELAYED_RELEASE_TABLET | Freq: Every day | ORAL | 11 refills | Status: DC

## 2021-07-21 NOTE — Progress Notes (Signed)
Alison Anderson    [id number]    Aug 10, 1955  Primary Care Physician:Tabori, Helane Rima, MD  Referring Physician: Sheliah Hatch, MD [address] [place],  [place] [address]   Chief complaint:  GERD  HPI:  66 yr old very pleasant female here for follow up visit for GERD and esophageal stricture. Overall she is doing much better, she is no longer having choking sensation or dysphagia s/p EGD with dilation.  She is currently using pantoprazole 40 mg every other day.  She never experienced any significant GERD symptoms or heartburn previously.  She has intermittent IBS symptoms with diarrhea.  She is status postcholecystectomy.  She is using Imodium as needed with good control of symptoms.  She prefers to hold off starting any other new medications at this point, as she feels her symptoms are manageable  Denies any rectal bleeding, melena, vomiting or abdominal pain.  She has occasional abdominal mild cramping.  EGD 02/11/21 - The Z-line was regular and was found 38 cm from the incisors. - One benign-appearing, intrinsic mild stenosis was found 37 to 38 cm from the incisors. This stenosis measured less than one cm (in length). The stenosis was traversed. The scope was withdrawn. Dilation was performed with a Maloney dilator with no resistance at 50 Fr and mild resistance at 54 Fr. The dilation site was examined following endoscope reinsertion and showed mild mucosal disruption. - Normal mucosa was found in the entire esophagus. Biopsies were obtained from the proximal and distal esophagus with cold forceps for histology of suspected eosinophilic esophagitis. - The stomach was normal. - The cardia and gastric fundus were normal on retroflexion. - The examined duodenum was normal.   Outpatient Encounter Medications as of 07/21/2021  Medication Sig   cyclobenzaprine (FLEXERIL) 10 MG tablet TAKE 1 TABLET BY MOUTH EVERY 8 HOURS AS NEEDED   meloxicam (MOBIC) 15 MG  tablet TAKE 1 TABLET BY MOUTH EVERY DAY   pantoprazole (PROTONIX) 40 MG tablet TAKE 1 TABLET BY MOUTH EVERY DAY   [DISCONTINUED] sulfamethoxazole-trimethoprim (BACTRIM DS) 800-160 MG tablet Take 1 tablet by mouth 2 (two) times daily.   No facility-administered encounter medications on file as of 07/21/2021.    Allergies as of 07/21/2021 - Review Complete 07/21/2021  Allergen Reaction Noted   Tramadol Itching and Rash 02/19/2011    Past Medical History:  Diagnosis Date   Allergy    Esophageal stenosis    GERD (gastroesophageal reflux disease)    Lumbar herniated disc    Menopause    at 48   Status post dilation of esophageal narrowing     Past Surgical History:  Procedure Laterality Date   ABDOMINAL HYSTERECTOMY  2012   back surgery  05/2016   herniated disc, pinched nerve    CHOLECYSTECTOMY     COLONOSCOPY      Family History  Problem Relation Age of Onset   Diabetes Mother    Heart disease Mother    Hypertension Mother    Diabetes Father        borderline   Cancer Maternal Grandmother        breast   Breast cancer Maternal Grandmother    Stroke Other        grandparent   Breast cancer Other        grandmother   Colon cancer Neg Hx    Colon polyps Neg Hx    Esophageal cancer Neg Hx  Rectal cancer Neg Hx    Stomach cancer Neg Hx     Social History   Socioeconomic History   Marital status: Married    Spouse name: Not on file   Number of children: Not on file   Years of education: Not on file   Highest education level: Not on file  Occupational History   Not on file  Tobacco Use   Smoking status: Never   Smokeless tobacco: Never  Substance and Sexual Activity   Alcohol use: Yes    Alcohol/week: 0.0 standard drinks    Comment: rarely   Drug use: No   Sexual activity: Not Currently  Other Topics Concern   Not on file  Social History Narrative   Lives with husband and daughter Baxter Hire   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   Physical Exam: Vitals:   07/21/21 1324  BP: 134/70  Pulse: (!) 120   Body mass index is 25.99 kg/m. Gen:      No acute distress HEENT:  sclera anicteric Abd:      soft, non-tender; no palpable masses, no distension Ext:    No edema Neuro: alert and oriented x 3 Psych: normal mood and affect  Data Reviewed:  Reviewed labs, radiology imaging, old records and pertinent past GI work up   Assessment and Plan/Recommendations:  66 year old very pleasant female with history of GERD, esophageal stricture and IBS diarrhea  Dysphagia secondary to esophageal stricture improved s/p EGD with esophageal dilation  GERD: Silent reflux, she does not have any baseline typical GERD symptoms Decrease PPI dose to pantoprazole 20 mg daily Follow antireflux measures  IBS diarrhea with abdominal cramping: Currently symptoms are minimal with occasional diarrhea.  Patient wants to hold off starting any new medications at this point. Use IBgard 1 capsule up to 3 times daily as needed for abdominal cramping If has worsening symptoms, will consider further work-up or management as needed  Return in 1 year  The patient was provided an opportunity to ask questions and all were answered. The patient agreed with the plan and demonstrated an understanding of the instructions.  Alison Anderson , MD    CC: Sheliah Hatch, MD

## 2021-07-21 NOTE — Patient Instructions (Addendum)
We have sent the following medications to your pharmacy for you to pick up at your convenience: pantoprazole 20 mg daily.   Patient advised to avoid spicy, acidic, citrus, chocolate, mints, fruit and fruit juices.  Limit the intake of caffeine, alcohol and Soda.  Don't exercise too soon after eating.  Don't lie down within 3-4 hours of eating.  Elevate the head of your bed.  Start over the counter IBGard 1 capsule by mouth three times a day as needed.   Follow up in 1 year  I appreciate the  opportunity to care for you  Thank You   Marsa Aris , MD

## 2021-07-23 ENCOUNTER — Other Ambulatory Visit: Payer: Self-pay | Admitting: Family Medicine

## 2021-07-31 ENCOUNTER — Other Ambulatory Visit: Payer: Self-pay | Admitting: Family Medicine

## 2021-07-31 DIAGNOSIS — M62830 Muscle spasm of back: Secondary | ICD-10-CM

## 2021-08-09 ENCOUNTER — Other Ambulatory Visit: Payer: Self-pay | Admitting: Gastroenterology

## 2021-08-09 DIAGNOSIS — K219 Gastro-esophageal reflux disease without esophagitis: Secondary | ICD-10-CM

## 2021-08-09 DIAGNOSIS — R1310 Dysphagia, unspecified: Secondary | ICD-10-CM

## 2021-09-30 ENCOUNTER — Other Ambulatory Visit: Payer: Self-pay | Admitting: Family Medicine

## 2021-09-30 DIAGNOSIS — M62830 Muscle spasm of back: Secondary | ICD-10-CM

## 2021-10-22 ENCOUNTER — Other Ambulatory Visit: Payer: Self-pay | Admitting: Family Medicine

## 2021-12-01 ENCOUNTER — Other Ambulatory Visit: Payer: Self-pay | Admitting: Family Medicine

## 2021-12-01 DIAGNOSIS — M62830 Muscle spasm of back: Secondary | ICD-10-CM

## 2022-01-21 ENCOUNTER — Ambulatory Visit: Payer: Medicare Other

## 2022-01-21 DIAGNOSIS — Z780 Asymptomatic menopausal state: Secondary | ICD-10-CM

## 2022-01-21 DIAGNOSIS — Z0000 Encounter for general adult medical examination without abnormal findings: Secondary | ICD-10-CM

## 2022-01-21 NOTE — Patient Instructions (Signed)
Alison Anderson , Thank you for taking time to come for your Medicare Wellness Visit. I appreciate your ongoing commitment to your health goals. Please review the following plan we discussed and let me know if I can assist you in the future.   Screening recommendations/referrals: Colonoscopy: 12/03/2016 Mammogram: 04/07/2021 Bone Density: referral 01/21/2022 Recommended yearly ophthalmology/optometry visit for glaucoma screening and checkup Recommended yearly dental visit for hygiene and checkup  Vaccinations: Influenza vaccine: completed  Pneumococcal vaccine: due  Tdap vaccine: 09/22/2020 Shingles vaccine: completed     Advanced directives: none   Conditions/risks identified: none   Next appointment: none    Preventive Care 65 Years and Older, Female Preventive care refers to lifestyle choices and visits with your health care provider that can promote health and wellness. What does preventive care include? A yearly physical exam. This is also called an annual well check. Dental exams once or twice a year. Routine eye exams. Ask your health care provider how often you should have your eyes checked. Personal lifestyle choices, including: Daily care of your teeth and gums. Regular physical activity. Eating a healthy diet. Avoiding tobacco and drug use. Limiting alcohol use. Practicing safe sex. Taking low-dose aspirin every day. Taking vitamin and mineral supplements as recommended by your health care provider. What happens during an annual well check? The services and screenings done by your health care provider during your annual well check will depend on your age, overall health, lifestyle risk factors, and family history of disease. Counseling  Your health care provider may ask you questions about your: Alcohol use. Tobacco use. Drug use. Emotional well-being. Home and relationship well-being. Sexual activity. Eating habits. History of falls. Memory and ability to  understand (cognition). Work and work Astronomer. Reproductive health. Screening  You may have the following tests or measurements: Height, weight, and BMI. Blood pressure. Lipid and cholesterol levels. These may be checked every 5 years, or more frequently if you are over 24 years old. Skin check. Lung cancer screening. You may have this screening every year starting at age 26 if you have a 30-pack-year history of smoking and currently smoke or have quit within the past 15 years. Fecal occult blood test (FOBT) of the stool. You may have this test every year starting at age 54. Flexible sigmoidoscopy or colonoscopy. You may have a sigmoidoscopy every 5 years or a colonoscopy every 10 years starting at age 75. Hepatitis C blood test. Hepatitis B blood test. Sexually transmitted disease (STD) testing. Diabetes screening. This is done by checking your blood sugar (glucose) after you have not eaten for a while (fasting). You may have this done every 1-3 years. Bone density scan. This is done to screen for osteoporosis. You may have this done starting at age 77. Mammogram. This may be done every 1-2 years. Talk to your health care provider about how often you should have regular mammograms. Talk with your health care provider about your test results, treatment options, and if necessary, the need for more tests. Vaccines  Your health care provider may recommend certain vaccines, such as: Influenza vaccine. This is recommended every year. Tetanus, diphtheria, and acellular pertussis (Tdap, Td) vaccine. You may need a Td booster every 10 years. Zoster vaccine. You may need this after age 63. Pneumococcal 13-valent conjugate (PCV13) vaccine. One dose is recommended after age 60. Pneumococcal polysaccharide (PPSV23) vaccine. One dose is recommended after age 49. Talk to your health care provider about which screenings and vaccines you need and how often  you need them. This information is not  intended to replace advice given to you by your health care provider. Make sure you discuss any questions you have with your health care provider. Document Released: 07/25/2015 Document Revised: 03/17/2016 Document Reviewed: 04/29/2015 Elsevier Interactive Patient Education  2017 [place] Prevention in the Home Falls can cause injuries. They can happen to people of all ages. There are many things you can do to make your home safe and to help prevent falls. What can I do on the outside of my home? Regularly fix the edges of walkways and driveways and fix any cracks. Remove anything that might make you trip as you walk through a door, such as a raised step or threshold. Trim any bushes or trees on the path to your home. Use bright outdoor lighting. Clear any walking paths of anything that might make someone trip, such as rocks or tools. Regularly check to see if handrails are loose or broken. Make sure that both sides of any steps have handrails. Any raised decks and porches should have guardrails on the edges. Have any leaves, snow, or ice cleared regularly. Use sand or salt on walking paths during winter. Clean up any spills in your garage right away. This includes oil or grease spills. What can I do in the bathroom? Use night lights. Install grab bars by the toilet and in the tub and shower. Do not use towel bars as grab bars. Use non-skid mats or decals in the tub or shower. If you need to sit down in the shower, use a plastic, non-slip stool. Keep the floor dry. Clean up any water that spills on the floor as soon as it happens. Remove soap buildup in the tub or shower regularly. Attach bath mats securely with double-sided non-slip rug tape. Do not have throw rugs and other things on the floor that can make you trip. What can I do in the bedroom? Use night lights. Make sure that you have a light by your bed that is easy to reach. Do not use any sheets or blankets that are  too big for your bed. They should not hang down onto the floor. Have a firm chair that has side arms. You can use this for support while you get dressed. Do not have throw rugs and other things on the floor that can make you trip. What can I do in the kitchen? Clean up any spills right away. Avoid walking on wet floors. Keep items that you use a lot in easy-to-reach places. If you need to reach something above you, use a strong step stool that has a grab bar. Keep electrical cords out of the way. Do not use floor polish or wax that makes floors slippery. If you must use wax, use non-skid floor wax. Do not have throw rugs and other things on the floor that can make you trip. What can I do with my stairs? Do not leave any items on the stairs. Make sure that there are handrails on both sides of the stairs and use them. Fix handrails that are broken or loose. Make sure that handrails are as long as the stairways. Check any carpeting to make sure that it is firmly attached to the stairs. Fix any carpet that is loose or worn. Avoid having throw rugs at the top or bottom of the stairs. If you do have throw rugs, attach them to the floor with carpet tape. Make sure that you have a light  switch at the top of the stairs and the bottom of the stairs. If you do not have them, ask someone to add them for you. What else can I do to help prevent falls? Wear shoes that: Do not have high heels. Have rubber bottoms. Are comfortable and fit you well. Are closed at the toe. Do not wear sandals. If you use a stepladder: Make sure that it is fully opened. Do not climb a closed stepladder. Make sure that both sides of the stepladder are locked into place. Ask someone to hold it for you, if possible. Clearly mark and make sure that you can see: Any grab bars or handrails. First and last steps. Where the edge of each step is. Use tools that help you move around (mobility aids) if they are needed. These  include: Canes. Walkers. Scooters. Crutches. Turn on the lights when you go into a dark area. Replace any light bulbs as soon as they burn out. Set up your furniture so you have a clear path. Avoid moving your furniture around. If any of your floors are uneven, fix them. If there are any pets around you, be aware of where they are. Review your medicines with your doctor. Some medicines can make you feel dizzy. This can increase your chance of falling. Ask your doctor what other things that you can do to help prevent falls. This information is not intended to replace advice given to you by your health care provider. Make sure you discuss any questions you have with your health care provider. Document Released: 04/24/2009 Document Revised: 12/04/2015 Document Reviewed: 08/02/2014 Elsevier Interactive Patient Education  2017 [employer].

## 2022-01-21 NOTE — Progress Notes (Signed)
Subjective:   Alison Anderson is a 66 y.o. female who presents for an Initial Medicare Annual Wellness Visit.   I connected with Venitta Backes today by telephone and verified that I am speaking with the correct person using two identifiers. Location patient: home Location provider: work Persons participating in the virtual visit: patient, provider.   I discussed the limitations, risks, security and privacy concerns of performing an evaluation and management service by telephone and the availability of in person appointments. I also discussed with the patient that there may be a patient responsible charge related to this service. The patient expressed understanding and verbally consented to this telephonic visit.    Interactive audio and video telecommunications were attempted between this provider and patient, however failed, due to patient having technical difficulties OR patient did not have access to video capability.  We continued and completed visit with audio only.    Review of Systems     Cardiac Risk Factors include: advanced age (>6men, >75 women)     Objective:    Today's Vitals   There is no height or weight on file to calculate BMI.     01/21/2022    2:16 PM 11/16/2016    4:03 PM 02/24/2016    3:33 PM 10/27/2015    4:03 PM  Advanced Directives  Does Patient Have a Medical Advance Directive? No No Yes No  Does patient want to make changes to medical advance directive?   No - Patient declined   Copy of Stella in Chart?   No - copy requested   Would patient like information on creating a medical advance directive? No - Patient declined   No - patient declined information    Current Medications (verified) Outpatient Encounter Medications as of 01/21/2022  Medication Sig   cyclobenzaprine (FLEXERIL) 10 MG tablet TAKE 1 TABLET BY MOUTH EVERY 8 HOURS AS NEEDED   meloxicam (MOBIC) 15 MG tablet TAKE 1 TABLET BY MOUTH EVERY DAY   pantoprazole (PROTONIX)  20 MG tablet Take 1 tablet (20 mg total) by mouth daily.   pantoprazole (PROTONIX) 20 MG tablet Take 1 tablet (20 mg total) by mouth daily.   pantoprazole (PROTONIX) 40 MG tablet TAKE 1 TABLET BY MOUTH EVERY DAY   No facility-administered encounter medications on file as of 01/21/2022.    Allergies (verified) Tramadol   History: Past Medical History:  Diagnosis Date   Allergy    Esophageal stenosis    GERD (gastroesophageal reflux disease)    Lumbar herniated disc    Menopause    at 40   Status post dilation of esophageal narrowing    Past Surgical History:  Procedure Laterality Date   ABDOMINAL HYSTERECTOMY  2012   back surgery  05/2016   herniated disc, pinched nerve    CHOLECYSTECTOMY     COLONOSCOPY     Family History  Problem Relation Age of Onset   Diabetes Mother    Heart disease Mother    Hypertension Mother    Diabetes Father        borderline   Cancer Maternal Grandmother        breast   Breast cancer Maternal Grandmother    Stroke Other        grandparent   Breast cancer Other        grandmother   Colon cancer Neg Hx    Colon polyps Neg Hx    Esophageal cancer Neg Hx    Rectal cancer  Neg Hx    Stomach cancer Neg Hx    Social History   Socioeconomic History   Marital status: Married    Spouse name: Not on file   Number of children: Not on file   Years of education: Not on file   Highest education level: Not on file  Occupational History   Not on file  Tobacco Use   Smoking status: Never   Smokeless tobacco: Never  Substance and Sexual Activity   Alcohol use: Yes    Alcohol/week: 0.0 standard drinks of alcohol    Comment: rarely   Drug use: No   Sexual activity: Not Currently  Other Topics Concern   Not on file  Social History Narrative   Lives with husband and daughter Baxter Hire   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  (01/21/2022)   Overall Financial Resource  Strain (CARDIA)    Difficulty of Paying Living Expenses: Not hard at all  Food Insecurity: No Food Insecurity (01/21/2022)   Hunger Vital Sign    Worried About Running Out of Food in the Last Year: Never true    Ran Out of Food in the Last Year: Never true  Transportation Needs: No Transportation Needs (01/21/2022)   PRAPARE - Administrator, Civil Service (Medical): No    Lack of Transportation (Non-Medical): No  Physical Activity: Sufficiently Active (01/21/2022)   Exercise Vital Sign    Days of Exercise per Week: 5 days    Minutes of Exercise per Session: 30 min  Stress: No Stress Concern Present (01/21/2022)   [employer] of Occupational Health - Occupational Stress Questionnaire    Feeling of Stress : Not at all  Social Connections: Moderately Isolated (01/21/2022)   Social Connection and Isolation Panel [NHANES]    Frequency of Communication with Friends and Family: Three times a week    Frequency of Social Gatherings with Friends and Family: Three times a week    Attends Religious Services: Never    Active Member of Clubs or Organizations: No    Attends Engineer, structural: Never    Marital Status: Married    Tobacco Counseling Counseling given: Not Answered   Clinical Intake:  Pre-visit preparation completed: Yes  Pain : No/denies pain     Nutritional Risks: None Diabetes: No  How often do you need to have someone help you when you read instructions, pamphlets, or other written materials from your doctor or pharmacy?: 1 - Never What is the last grade level you completed in school?: college  Diabetic?no   Interpreter Needed?: No  Information entered by :: L.Gwenna Fuston,LPN   Activities of Daily Living    01/21/2022    2:21 PM  In your present state of health, do you have any difficulty performing the following activities:  Hearing? 0  Vision? 0  Difficulty concentrating or making decisions? 0  Walking or climbing stairs? 0  Dressing  or bathing? 0  Doing errands, shopping? 0  Preparing Food and eating ? N  Using the Toilet? N  In the past six months, have you accidently leaked urine? N  Do you have problems with loss of bowel control? N  Managing your Medications? N  Managing your Finances? N  Housekeeping or managing your Housekeeping? N    Patient Care Team: Sheliah Hatch, MD as PCP - General  Indicate any recent Medical Services you may have received from  other than [facility] providers in the past year (date may be approximate).     Assessment:   This is a routine wellness examination for Alison Anderson.  Hearing/Vision screen Vision Screening - Comments:: Patient will schedule   Dietary issues and exercise activities discussed: Current Exercise Habits: Home exercise routine, Type of exercise: walking, Time (Minutes): 30, Frequency (Times/Week): 3, Weekly Exercise (Minutes/Week): 90, Intensity: Mild, Exercise limited by: None identified   Goals Addressed   None    Depression Screen    01/21/2022    2:17 PM 01/21/2022    2:15 PM 05/14/2021   11:40 AM 12/15/2020    1:33 PM 12/09/2020    2:53 PM 09/22/2020    9:44 AM 07/28/2020    8:53 AM  PHQ 2/9 Scores  PHQ - 2 Score 0 0 0 0 0 0 0  PHQ- 9 Score    0  0 0    Fall Risk    01/21/2022    2:17 PM 05/14/2021   11:40 AM 04/01/2021    1:15 PM 12/15/2020    1:33 PM 12/09/2020    2:53 PM  Fall Risk   Falls in the past year? 0 0 0 0 0  Number falls in past yr: 0 0 0 0 0  Injury with Fall? 0 0 0 0 0  Risk for fall due to :  No Fall Risks No Fall Risks No Fall Risks   Follow up Falls evaluation completed;Education provided Falls evaluation completed Falls evaluation completed  Falls evaluation completed    FALL RISK PREVENTION PERTAINING TO THE HOME:  Any stairs in or around the home? Yes  If so, are there any without handrails? No  Home free of loose throw rugs in walkways, pet beds, electrical cords, etc? Yes  Adequate lighting in your home to reduce risk of  falls? Yes   ASSISTIVE DEVICES UTILIZED TO PREVENT FALLS:  Life alert? No  Use of a cane, walker or w/c? No  Grab bars in the bathroom? No  Shower chair or bench in shower? No  Elevated toilet seat or a handicapped toilet? No   Cognitive Function:  Normal cognitive status assessed by telephone conversation  by this Nurse Health Advisor. No abnormalities found.        Immunizations Immunization History  Administered Date(s) Administered   Influenza Inj Mdck Quad Pf 06/09/2016   Influenza, Seasonal, Injecte, Preservative Fre 05/28/2014   Influenza,inj,Quad PF,6+ Mos 05/12/2015, 05/04/2017, 04/20/2018, 03/30/2019, 04/29/2020   Influenza-Unspecified 05/28/2014, 03/31/2021   PFIZER Comirnaty(Gray Top)Covid-19 Tri-Sucrose Vaccine 10/01/2019, 10/22/2019, 04/29/2020   PFIZER(Purple Top)SARS-COV-2 Vaccination 03/31/2021   Tdap 06/05/2014, 09/22/2020   Zoster Recombinat (Shingrix) 12/09/2020, 01/09/2021    TDAP status: Up to date  Flu Vaccine status: Up to date  Pneumococcal vaccine status: Due, Education has been provided regarding the importance of this vaccine. Advised may receive this vaccine at local pharmacy or Health Dept. Aware to provide a copy of the vaccination record if obtained from local pharmacy or Health Dept. Verbalized acceptance and understanding.  Covid-19 vaccine status: Completed vaccines  Qualifies for Shingles Vaccine? Yes   Zostavax completed Yes   Shingrix Completed?: Yes  Screening Tests Health Maintenance  Topic Date Due   HIV Screening  Never done   Hepatitis C Screening  Never done   COVID-19 Vaccine (5 - Pfizer series) 05/26/2021   DEXA SCAN  Never done   Pneumonia Vaccine 18+ Years old (1 - PCV) 01/05/2022   INFLUENZA VACCINE  02/09/2022  MAMMOGRAM  04/07/2022   COLONOSCOPY (Pts 45-42yrs Insurance coverage will need to be confirmed)  12/04/2026   TETANUS/TDAP  09/23/2030   Zoster Vaccines- Shingrix  Completed   HPV VACCINES  Aged Out     Health Maintenance  Health Maintenance Due  Topic Date Due   HIV Screening  Never done   Hepatitis C Screening  Never done   COVID-19 Vaccine (5 - Pfizer series) 05/26/2021   DEXA SCAN  Never done   Pneumonia Vaccine 53+ Years old (1 - PCV) 01/05/2022    Colorectal cancer screening: Type of screening: Colonoscopy. Completed 12/03/2016. Repeat every 10 years  Mammogram status: Completed 04/07/2021. Repeat every year  Bone Density status: Ordered 01/21/2022. Pt provided with contact info and advised to call to schedule appt.  Lung Cancer Screening: (Low Dose CT Chest recommended if Age 11-80 years, 30 pack-year currently smoking OR have quit w/in 15years.) does not qualify.   Lung Cancer Screening Referral: n/a  Additional Screening:  Hepatitis C Screening: does not qualify;   Vision Screening: Recommended annual ophthalmology exams for early detection of glaucoma and other disorders of the eye. Is the patient up to date with their annual eye exam?  No  Who is the provider or what is the name of the office in which the patient attends annual eye exams? Patient will call to schedule  If pt is not established with a provider, would they like to be referred to a provider to establish care? No .   Dental Screening: Recommended annual dental exams for proper oral hygiene  Community Resource Referral / Chronic Care Management: CRR required this visit?  No   CCM required this visit?  No      Plan:     I have personally reviewed and noted the following in the patient's chart:   Medical and social history Use of alcohol, tobacco or illicit drugs  Current medications and supplements including opioid prescriptions. Patient is not currently taking opioid prescriptions. Functional ability and status Nutritional status Physical activity Advanced directives List of other physicians Hospitalizations, surgeries, and ER visits in previous 12 months Vitals Screenings to include  cognitive, depression, and falls Referrals and appointments  In addition, I have reviewed and discussed with patient certain preventive protocols, quality metrics, and best practice recommendations. A written personalized care plan for preventive services as well as general preventive health recommendations were provided to patient.     Randel Pigg, LPN   [phone]   Nurse Notes: none

## 2022-01-22 ENCOUNTER — Other Ambulatory Visit: Payer: Self-pay | Admitting: Family Medicine

## 2022-02-02 ENCOUNTER — Other Ambulatory Visit: Payer: Self-pay | Admitting: Family Medicine

## 2022-02-02 DIAGNOSIS — Z780 Asymptomatic menopausal state: Secondary | ICD-10-CM

## 2022-02-04 ENCOUNTER — Other Ambulatory Visit: Payer: Self-pay | Admitting: Family Medicine

## 2022-02-04 DIAGNOSIS — M62830 Muscle spasm of back: Secondary | ICD-10-CM

## 2022-02-05 ENCOUNTER — Ambulatory Visit
Admission: RE | Admit: 2022-02-05 | Discharge: 2022-02-05 | Disposition: A | Discharge status: Home / Self Care | Payer: Medicare Other | Source: Ambulatory Visit | Attending: Family Medicine | Admitting: Family Medicine

## 2022-02-05 DIAGNOSIS — Z780 Asymptomatic menopausal state: Secondary | ICD-10-CM

## 2022-02-08 ENCOUNTER — Telehealth: Payer: Self-pay

## 2022-02-08 NOTE — Telephone Encounter (Signed)
-----   Message from Sheliah Hatch, MD sent at 02/07/2022  5:05 PM EDT ----- Your bone density shows mild osteopenia (thinning of bone but not as severe as osteoporosis).  Make sure you are taking daily Calcium and Vit D supplements

## 2022-02-08 NOTE — Telephone Encounter (Signed)
Informed pt of dexa scan results

## 2022-03-08 ENCOUNTER — Other Ambulatory Visit: Payer: Self-pay | Admitting: Family Medicine

## 2022-03-08 DIAGNOSIS — Z1231 Encounter for screening mammogram for malignant neoplasm of breast: Secondary | ICD-10-CM

## 2022-04-09 ENCOUNTER — Other Ambulatory Visit: Payer: Self-pay | Admitting: Family Medicine

## 2022-04-09 DIAGNOSIS — M62830 Muscle spasm of back: Secondary | ICD-10-CM

## 2022-04-13 ENCOUNTER — Ambulatory Visit: Payer: Medicare Other

## 2022-04-14 ENCOUNTER — Ambulatory Visit: Payer: Medicare Other

## 2022-04-20 ENCOUNTER — Other Ambulatory Visit: Payer: Self-pay | Admitting: Family Medicine

## 2022-04-21 ENCOUNTER — Telehealth: Payer: Self-pay | Admitting: Family Medicine

## 2022-04-21 NOTE — Telephone Encounter (Signed)
Spoke to the Alison Anderson and she received her bill yesterday and the bill was for $445 . She called our billing dept and they advised her to call Medicare . So Alison Anderson did and they told her the visit needed to be coded as a well to medicare visit not an annual . Alison Anderson is new to Medicare . They told the Alison Anderson that Dr Birdie Riddle will need to redo the code and resubmit the claim

## 2022-04-21 NOTE — Telephone Encounter (Signed)
Please look into the following billing question below:   Has patient reached out to billing?    If no, please advise patient to reach out to the billing department at [phone].  If yes, what did billing advise the patient?    Is the patient calling in regard to a [employer] or [employer]?   If yes, please have patient reach out to [employer] or [employer] located on their bill received.   If patient was advised by [employer] or [employer] to reach out to the office, what did their billing department advise? Please have patient send copy of bill to the office.   Patient Name: Alison Anderson  MRN: [id number] [place] ID:   DOB: [birth year] Date of Service:  01/21/22 Amount: $445.00   Additional Comments:  Pt called stating that she has spoke with the billing dept with [facility]. Pt stated that she was told by billing that her visit was coded wrong.   Please advise patient that we do not handle billing in the practice.  We will submit this concern to billing leadership.  The patient will be followed up with either by Billing leadership or Billing Customer Service.

## 2022-04-22 NOTE — Telephone Encounter (Signed)
Spoke to the pt and explained to the pt that this matter was sent to Alison Anderson who did the AWV visit and her supervisor to be handled . I advised her if she does not hear from anyone within a week to  please call our office and ask for Alison Anderson so she can guide her to the correct supervisor Pt was very happy that I did send this matter and it is getting handled . Marland Kitchen

## 2022-04-28 NOTE — Telephone Encounter (Signed)
Patient called to follow up on the billing of her AWV. I let patient know that I needed to reach out to billing and see what I needed to do to get this taken care of for her and I would give her a call back. I reached out to [facility] and she advised me that she could take care of it. I called patient back and let her know.

## 2022-05-03 NOTE — Telephone Encounter (Signed)
Pt called back asking to speak with Claiborne Billings, regarding a update on her AWV bill.

## 2022-05-05 NOTE — Telephone Encounter (Signed)
Called patient back and let her know that we spoke about this previously and I had already informed her that the bill would be taken care of per [facility] in billing. I reiterated that there was nothing else she needed to do at this time but if she received another bill to call and let us know. Patient voiced understanding.

## 2022-05-18 ENCOUNTER — Ambulatory Visit: Payer: Medicare Other

## 2022-06-10 ENCOUNTER — Encounter: Payer: Self-pay | Admitting: Family Medicine

## 2022-06-10 ENCOUNTER — Ambulatory Visit (INDEPENDENT_AMBULATORY_CARE_PROVIDER_SITE_OTHER): Payer: Medicare Other | Admitting: Family Medicine

## 2022-06-10 VITALS — BP 146/88 | HR 108 | Temp 98.6°F | Resp 16 | Ht 66.0 in | Wt 154.0 lb

## 2022-06-10 DIAGNOSIS — R300 Dysuria: Secondary | ICD-10-CM | POA: Diagnosis not present

## 2022-06-10 MED ORDER — CEPHALEXIN 500 MG PO CAPS: 500.0000 mg | ORAL_CAPSULE | Freq: Two times a day (BID) | ORAL | 0 refills | Status: AC

## 2022-06-10 NOTE — Progress Notes (Signed)
   Subjective:    Patient ID: Alison Anderson, female    DOB: 1955/11/03, 66 y.o.   MRN: [id number]  HPI Dysuria- 'i haven't had one in months and months'.  She had been taking cranberry but stopped for a few weeks.  Pt reports sxs are similar to prior infections and started ~5 days ago.  + dysuria, urgency, frequency.  + pulling sensation.  No suprapubic tenderness.  Despite urgency will only void a few drops.     Review of Systems For ROS see HPI     Objective:   Physical Exam Vitals reviewed.  Constitutional:      General: She is not in acute distress.    Appearance: Normal appearance. She is well-developed.  Abdominal:     General: There is no distension.     Palpations: Abdomen is soft.     Tenderness: There is no abdominal tenderness (no suprapubic or CVA tenderness).  Skin:    General: Skin is warm and dry.  Neurological:     General: No focal deficit present.     Mental Status: She is alert and oriented to person, place, and time.  Psychiatric:        Mood and Affect: Mood normal.        Behavior: Behavior normal.        Thought Content: Thought content normal.           Assessment & Plan:   Dysuria- pt was not able to provide a sample in office today but her sxs are consistent w/ previous UTIs.  Will start empiric Keflex.  She will need to return for a urine if sxs fail to improve on abx.  Pt expressed understanding and is in agreement w/ plan.

## 2022-06-10 NOTE — Patient Instructions (Signed)
Follow up as needed or as scheduled START the Cephalexin twice daily Drink LOTS of fluids Call with any questions or concerns Hang in there!!! 

## 2022-06-12 ENCOUNTER — Other Ambulatory Visit: Payer: Self-pay | Admitting: Family Medicine

## 2022-06-12 DIAGNOSIS — M62830 Muscle spasm of back: Secondary | ICD-10-CM

## 2022-06-14 NOTE — Telephone Encounter (Signed)
Patient is requesting a refill of the following medications: Requested Prescriptions   Pending Prescriptions Disp Refills   cyclobenzaprine (FLEXERIL) 10 MG tablet [Pharmacy Med Name: CYCLOBENZAPRINE 10 MG TABLET] 90 tablet 1    Sig: TAKE 1 TABLET BY MOUTH EVERY 8 HOURS AS NEEDED    Date of patient request: 06/14/22 Last office visit: 06/10/22 Date of last refill: 04/09/22 Last refill amount:90

## 2022-06-15 ENCOUNTER — Telehealth: Payer: Self-pay | Admitting: Family Medicine

## 2022-06-15 NOTE — Telephone Encounter (Signed)
Patient called stating that she has finished her medication for UTI but she is still having a pulling sensation in her vaginal area and doesn't think that 5 days was enough medication. She wanted to know if Dr Beverely Low could send in another refill

## 2022-06-15 NOTE — Telephone Encounter (Signed)
Informed pt she needs to come by and do a Urine dip and culture . I put pt on nurse visit for Thursday at 10 am

## 2022-06-15 NOTE — Telephone Encounter (Signed)
Since she wasn't able to give a sample last time, I would prefer she come do a UA and culture so we know what we are treating.  I don't want to send a refill if the infection is resistant.

## 2022-06-15 NOTE — Telephone Encounter (Signed)
Attempted a call rang once then cut off, not VM available for a message

## 2022-06-17 ENCOUNTER — Ambulatory Visit: Payer: Medicare Other

## 2022-07-16 ENCOUNTER — Ambulatory Visit
Admission: RE | Admit: 2022-07-16 | Discharge: 2022-07-16 | Disposition: A | Discharge status: Home / Self Care | Payer: Medicare Other | Source: Ambulatory Visit | Attending: Family Medicine | Admitting: Family Medicine

## 2022-07-16 DIAGNOSIS — Z1231 Encounter for screening mammogram for malignant neoplasm of breast: Secondary | ICD-10-CM

## 2022-07-17 ENCOUNTER — Other Ambulatory Visit: Payer: Self-pay | Admitting: Family Medicine

## 2022-07-19 ENCOUNTER — Encounter: Payer: Self-pay | Admitting: Family Medicine

## 2022-07-19 ENCOUNTER — Ambulatory Visit (INDEPENDENT_AMBULATORY_CARE_PROVIDER_SITE_OTHER): Payer: Medicare Other | Admitting: Family Medicine

## 2022-07-19 VITALS — BP 126/82 | HR 116 | Temp 98.6°F | Resp 17 | Ht 66.0 in | Wt 163.2 lb

## 2022-07-19 DIAGNOSIS — R300 Dysuria: Secondary | ICD-10-CM

## 2022-07-19 LAB — POCT URINALYSIS DIPSTICK
Bilirubin, UA: NEGATIVE
Blood, UA: POSITIVE
Glucose, UA: NEGATIVE
Ketones, UA: NEGATIVE
Leukocytes, UA: NEGATIVE
Nitrite, UA: NEGATIVE
Protein, UA: POSITIVE — AB
Spec Grav, UA: 1.025
Urobilinogen, UA: 0.2 U/dL
pH, UA: 8

## 2022-07-19 MED ORDER — CEPHALEXIN 500 MG PO CAPS: 500.0000 mg | ORAL_CAPSULE | Freq: Two times a day (BID) | ORAL | 0 refills | Status: AC

## 2022-07-19 NOTE — Patient Instructions (Signed)
Follow up as needed or as scheduled We'll notify you of your culture results and make any changes if needed Continue to drink lots of fluids and empty your bladder regularly START the Cephalexin twice daily to treat infection Call with any questions or concerns Stay Safe!  Stay Healthy! Hang in there!!!

## 2022-07-19 NOTE — Progress Notes (Signed)
   Subjective:    Patient ID: Alison Anderson, female    DOB: 10/30/1955, 67 y.o.   MRN: [id number]  HPI Dysuria- sxs started Thursday or Friday w/ 'a little pulling'.  Sxs were severe Saturday and Sunday.  Increased frequency but little output.  Pt had suprapubic pain, burning w/ urination.  No blood in urine.  No fevers.  Some LBP.  No CVA pain.  Last UTI 06/10/22.     Review of Systems For ROS see HPI     Objective:   Physical Exam Vitals reviewed.  Constitutional:      General: She is not in acute distress.    Appearance: Normal appearance. She is well-developed. She is not ill-appearing.  Pulmonary:     Effort: Pulmonary effort is normal. No respiratory distress.  Abdominal:     General: Abdomen is flat. There is no distension.     Palpations: Abdomen is soft.     Tenderness: There is no abdominal tenderness (no suprapubic or CVA tenderness).  Skin:    General: Skin is warm and dry.  Neurological:     General: No focal deficit present.     Mental Status: She is alert and oriented to person, place, and time.  Psychiatric:        Mood and Affect: Mood normal.        Behavior: Behavior normal.        Thought Content: Thought content normal.           Assessment & Plan:  Dysuria- pt has hx of recurrent UTI.  Sxs and UA consistent w/ infxn.  Start Keflex and send urine for culture.  Adjust tx prn.  Pt expressed understanding and is in agreement w/ plan.

## 2022-07-21 ENCOUNTER — Telehealth: Payer: Self-pay | Admitting: Gastroenterology

## 2022-07-21 ENCOUNTER — Telehealth: Payer: Self-pay

## 2022-07-21 LAB — URINE CULTURE
MICRO NUMBER:: 14402095
SPECIMEN QUALITY:: ADEQUATE

## 2022-07-21 NOTE — Telephone Encounter (Signed)
Informed pt of lab results  

## 2022-07-21 NOTE — Telephone Encounter (Signed)
-----   Message from Midge Minium, MD sent at 07/21/2022  3:45 PM EST ----- Your urine is being treated appropriately w/ the Cephalexin.  This is good news!  Hope you are feeling better

## 2022-07-21 NOTE — Telephone Encounter (Signed)
Inbound call from patient requesting a call back to discuss if there is anyway she can switch her Protonix proscription to 40 mg instead of 20 mg. Patient stated that she is having issues with food getting stuck. Please advise.

## 2022-07-22 NOTE — Telephone Encounter (Signed)
Spoke with the patient. She denies reflux, indigestion or cough. Occasionally has a sensation of food not passing smoothly through the esophagus. She says it has just started. It is not every time she eats. She would like to resume Protonix 40 mg if that is okay with you. She was last seen 1 year ago.

## 2022-07-28 NOTE — Telephone Encounter (Signed)
Agree with restarting pantoprazole 40 mg daily, please send prescription for 90 days and schedule office visit next available with me or APP.  Thank you

## 2022-07-29 ENCOUNTER — Other Ambulatory Visit: Payer: Self-pay

## 2022-07-29 MED ORDER — PANTOPRAZOLE SODIUM 40 MG PO TBEC: 40.0000 mg | DELAYED_RELEASE_TABLET | Freq: Every day | ORAL | 0 refills | Status: DC

## 2022-07-29 NOTE — Telephone Encounter (Signed)
Spoke with the patient. Advised of the recommendations. Assisted her in scheduling an appointment. Prescription sent to her pharmacy.

## 2022-08-11 ENCOUNTER — Other Ambulatory Visit: Payer: Self-pay | Admitting: Family Medicine

## 2022-08-11 DIAGNOSIS — M62830 Muscle spasm of back: Secondary | ICD-10-CM

## 2022-09-09 NOTE — Progress Notes (Signed)
Alison Anderson    [id number]    Sep 25, 1955  Primary Care Physician:Tabori, Aundra Millet, MD  Referring Physician: Midge Minium, MD [address] [place],  [place] [address]   Chief complaint:  GERD  HPI: 67 yr old very pleasant female here for follow up visit for GERD and esophageal stricture.  I last saw her on 07/21/2021. At that time she reported feeling much better. Her dysphagia and GERD symptoms have been resolved. She was on pantoprazole 40 mg every other day. She had intermittent IBS symptoms with diarrhea and was taken Imodium PRN which she saw improvement on.    Today, she reports feeling good. She states that her acid reflux symptoms have significantly improved after taking 40 mg of Pantoprazole. She reports having minimal episodes of acid reflux and discomfort. She reports having diarrhea 2-3 a week and having typically 2x BM a day.      It is mostly semiformed stool not liquid watery diarrhea.  She denies nausea, vomiting, indigestion, black stool, blood stool, weight changes.   She denies taking any fiber. She denies eating any spicy food.    GI Hx:   EGD 02/11/21 - The Z-line was regular and was found 38 cm from the incisors. - One benign-appearing, intrinsic mild stenosis was found 37 to 38 cm from the incisors. This stenosis measured less than one cm (in length). The stenosis was traversed. The scope was withdrawn. Dilation was performed with a Maloney dilator with no resistance at 50 Fr and mild resistance at 54 Fr. The dilation site was examined following endoscope reinsertion and showed mild mucosal disruption. - Normal mucosa was found in the entire esophagus. Biopsies were obtained from the proximal and distal esophagus with cold forceps for histology of suspected eosinophilic esophagitis. - The stomach was normal. - The cardia and gastric fundus were normal on retroflexion. - The examined duodenum was normal.  Colonoscopy  11/30/2016 Internal hemorrhoids otherwise normal exam. Recall colonoscopy in 10 years   Current Outpatient Medications:    cyclobenzaprine (FLEXERIL) 10 MG tablet, TAKE 1 TABLET BY MOUTH EVERY 8 HOURS AS NEEDED, Disp: 90 tablet, Rfl: 1   meloxicam (MOBIC) 15 MG tablet, TAKE 1 TABLET BY MOUTH EVERY DAY, Disp: 90 tablet, Rfl: 0   pantoprazole (PROTONIX) 40 MG tablet, Take 1 tablet (40 mg total) by mouth daily. Please schedule an office visit, Disp: 90 tablet, Rfl: 0    Allergies as of 09/10/2022 - Review Complete 09/10/2022  Allergen Reaction Noted   Tramadol Itching and Rash 02/19/2011    Past Medical History:  Diagnosis Date   Allergy    Esophageal stenosis    GERD (gastroesophageal reflux disease)    Lumbar herniated disc    Menopause    at 73   Status post dilation of esophageal narrowing     Past Surgical History:  Procedure Laterality Date   ABDOMINAL HYSTERECTOMY  2012   back surgery  05/2016   herniated disc, pinched nerve    CHOLECYSTECTOMY     COLONOSCOPY      Family History  Problem Relation Age of Onset   Diabetes Mother    Heart disease Mother    Hypertension Mother    Diabetes Father        borderline   Cancer Maternal Grandmother        breast   Breast cancer Maternal Grandmother    Stroke Other  grandparent   Breast cancer Other        grandmother   Colon cancer Neg Hx    Colon polyps Neg Hx    Esophageal cancer Neg Hx    Rectal cancer Neg Hx    Stomach cancer Neg Hx     Social History   Socioeconomic History   Marital status: Married    Spouse name: Not on file   Number of children: Not on file   Years of education: Not on file   Highest education level: Not on file  Occupational History   Not on file  Tobacco Use   Smoking status: Never   Smokeless tobacco: Never  Substance and Sexual Activity   Alcohol use: Yes    Alcohol/week: 0.0 standard drinks of alcohol    Comment: rarely   Drug use: No   Sexual activity: Not  Currently  Other Topics Concern   Not on file  Social History Narrative   Lives with husband and daughter Cyril Mourning   Works as a Oncologist at [employer]: [place]  (01/21/2022)   Overall Financial Resource Strain (CARDIA)    Difficulty of Paying Living Expenses: Not hard at all  Food Insecurity: No Food Insecurity (01/21/2022)   Hunger Vital Sign    Worried About Running Out of Food in the Last Year: Never true    Ran Out of Food in the Last Year: Never true  Transportation Needs: No Transportation Needs (01/21/2022)   PRAPARE - Hydrologist (Medical): No    Lack of Transportation (Non-Medical): No  Physical Activity: Sufficiently Active (01/21/2022)   Exercise Vital Sign    Days of Exercise per Week: 5 days    Minutes of Exercise per Session: 30 min  Stress: No Stress Concern Present (01/21/2022)   [place]    Feeling of Stress : Not at all  Social Connections: Moderately Isolated (01/21/2022)   Social Connection and Isolation Panel [NHANES]    Frequency of Communication with Friends and Family: Three times a week    Frequency of Social Gatherings with Friends and Family: Three times a week    Attends Religious Services: Never    Active Member of Clubs or Organizations: No    Attends Archivist Meetings: Never    Marital Status: Married  Human resources officer Violence: Not At Risk (01/21/2022)   Humiliation, Afraid, Rape, and Kick questionnaire    Fear of Current or Ex-Partner: No    Emotionally Abused: No    Physically Abused: No    Sexually Abused: No      Review of systems: Review of Systems  Constitutional:  Negative for unexpected weight change.  Gastrointestinal:  Positive for diarrhea. Negative for abdominal distention, abdominal pain, blood in stool and nausea.       -indigestion -black stool       Physical Exam: General: well-appearing  Eyes: sclera anicteric, no redness ENT: oral mucosa moist without lesions, no cervical or supraclavicular lymphadenopathy CV: RRR, no JVD, no peripheral edema Resp: clear to auscultation bilaterally, normal RR and effort noted GI: soft, no tenderness, with active bowel sounds. No guarding or palpable organomegaly noted. No distention. Skin; warm and dry, no rash or jaundice noted Neuro: awake, alert and oriented x 3. Normal gross motor function and fluent speech   Data Reviewed:  Reviewed labs, radiology imaging, old records  and pertinent past GI work up   Assessment and Plan/Recommendations:  67 year old very pleasant female with history of GERD, esophageal stricture and IBS diarrhea  Dysphagia secondary to esophageal stricture improved s/p EGD with esophageal dilation  GERD: Silent reflux, she does not have any baseline typical GERD symptoms Continue pantoprazole 40 mg daily Follow antireflux measures  IBS diarrhea with abdominal cramping:  Use IBgard 1 capsule up to 3 times daily as needed for abdominal cramping Add Benefiber 1 tablespoon 3 times daily with meals If has worsening symptoms, will consider further work-up or management as needed  Return in 6 months to 1 year or sooner if needed   This visit required 30 minutes of patient care (this includes precharting, chart review, review of results, face-to-face time used for counseling as well as treatment plan and follow-up. The patient was provided an opportunity to ask questions and all were answered. The patient agreed with the plan and demonstrated an understanding of the instructions.  Damaris Hippo , MD    CC: Midge Minium, MD

## 2022-09-10 ENCOUNTER — Encounter: Payer: Self-pay | Admitting: Gastroenterology

## 2022-09-10 ENCOUNTER — Ambulatory Visit (INDEPENDENT_AMBULATORY_CARE_PROVIDER_SITE_OTHER): Payer: Medicare Other | Admitting: Gastroenterology

## 2022-09-10 VITALS — BP 136/68 | HR 70 | Ht 67.0 in | Wt 163.0 lb

## 2022-09-10 DIAGNOSIS — K2100 Gastro-esophageal reflux disease with esophagitis, without bleeding: Secondary | ICD-10-CM

## 2022-09-10 DIAGNOSIS — K580 Irritable bowel syndrome with diarrhea: Secondary | ICD-10-CM

## 2022-09-10 DIAGNOSIS — K219 Gastro-esophageal reflux disease without esophagitis: Secondary | ICD-10-CM | POA: Insufficient documentation

## 2022-09-10 MED ORDER — PANTOPRAZOLE SODIUM 40 MG PO TBEC: 40.0000 mg | DELAYED_RELEASE_TABLET | Freq: Every day | ORAL | 3 refills | Status: DC

## 2022-09-10 NOTE — Patient Instructions (Signed)
We have sent the following medications to your pharmacy for you to pick up at your convenience:  Pantoprazole  Take Benefiber 1 tablespoon three times a day with meals  Take IBgard 1 capsule three times a day as needed  Follow up in 1 year   _______________________________________________________  If your blood pressure at your visit was 140/90 or greater, please contact your primary care physician to follow up on this.  _______________________________________________________  If you are age 5 or older, your body mass index should be between 23-30. Your Body mass index is 25.53 kg/m. If this is out of the aforementioned range listed, please consider follow up with your Primary Care Provider.  If you are age 64 or younger, your body mass index should be between 19-25. Your Body mass index is 25.53 kg/m. If this is out of the aformentioned range listed, please consider follow up with your Primary Care Provider.   ________________________________________________________  The [facility] providers would like to encourage you to use [facility] to communicate with providers for non-urgent requests or questions.  Due to long hold times on the telephone, sending your provider a message by [facility] may be a faster and more efficient way to get a response.  Please allow 48 business hours for a response.  Please remember that this is for non-urgent requests.  _______________________________________________________    Thank you for choosing [facility]  Kavitha Nandigam,MD

## 2022-09-27 ENCOUNTER — Ambulatory Visit: Payer: Medicare Other | Admitting: Family Medicine

## 2022-09-28 ENCOUNTER — Encounter: Payer: Self-pay | Admitting: Family Medicine

## 2022-09-28 ENCOUNTER — Ambulatory Visit (INDEPENDENT_AMBULATORY_CARE_PROVIDER_SITE_OTHER): Payer: Medicare Other | Admitting: Family Medicine

## 2022-09-28 VITALS — BP 124/80 | HR 74 | Temp 97.9°F | Resp 16 | Ht 67.0 in | Wt 162.5 lb

## 2022-09-28 DIAGNOSIS — N390 Urinary tract infection, site not specified: Secondary | ICD-10-CM

## 2022-09-28 DIAGNOSIS — R300 Dysuria: Secondary | ICD-10-CM

## 2022-09-28 LAB — POCT URINALYSIS DIPSTICK
Glucose, UA: NEGATIVE
Protein, UA: NEGATIVE
Spec Grav, UA: 1.015 (ref 1.010–1.025)
Urobilinogen, UA: 0.2 E.U./dL
pH, UA: 7 (ref 5.0–8.0)

## 2022-09-28 MED ORDER — CEPHALEXIN 500 MG PO CAPS: 500.0000 mg | ORAL_CAPSULE | Freq: Two times a day (BID) | ORAL | 0 refills | Status: AC

## 2022-09-28 NOTE — Patient Instructions (Signed)
Follow up as needed or as scheduled START the Cephalexin twice daily- take w/ food Drink LOTS of fluids Think about the vaginal estrogen and let me know Call with any questions or concerns Stay Safe!  Stay Healthy!!

## 2022-09-28 NOTE — Progress Notes (Signed)
   Subjective:    Patient ID: Alison Anderson, female    DOB: [birth year], 67 y.o.   MRN: [id number]  HPI Dysuria- pt has recurrent infections.  Taking Cranberry regularly.  Sxs started 4-5 days ago.  Increased frequency, urgency, burning w/ urination.  Denies fevers, chills.  No CVA tenderness.     Review of Systems For ROS see HPI     Objective:   Physical Exam Vitals reviewed.  Constitutional:      General: She is not in acute distress.    Appearance: Normal appearance. She is well-developed. She is not ill-appearing.  Abdominal:     General: There is no distension.     Palpations: Abdomen is soft.     Tenderness: There is no abdominal tenderness (no suprapubic or CVA tenderness).  Skin:    General: Skin is warm and dry.  Neurological:     General: No focal deficit present.     Mental Status: She is alert and oriented to person, place, and time.  Psychiatric:        Mood and Affect: Mood normal.        Behavior: Behavior normal.        Thought Content: Thought content normal.           Assessment & Plan:   Recurrent UTI- pt's sxs and UA consistent w/ infxn.  She has been getting infections every 2-3 months.  Discussed that this is common in post-menopausal women due to lack of estrogen.  Told her that vaginal estrogen can be very helpful in these situations.  She wants to think about it and check w/ her insurance company.  Will start Keflex for current infxn and adjust meds if needed based on culture results.

## 2022-09-30 ENCOUNTER — Telehealth: Payer: Self-pay

## 2022-09-30 LAB — URINE CULTURE
MICRO NUMBER:: 14711804
SPECIMEN QUALITY:: ADEQUATE

## 2022-09-30 NOTE — Telephone Encounter (Signed)
Informed pt of lab results  

## 2022-09-30 NOTE — Telephone Encounter (Signed)
-----   Message from Midge Minium, MD sent at 09/30/2022 11:26 AM EDT ----- Your UTI is being treated appropriately.  This is great news!

## 2022-10-04 ENCOUNTER — Other Ambulatory Visit: Payer: Self-pay | Admitting: Family Medicine

## 2022-10-04 DIAGNOSIS — M62830 Muscle spasm of back: Secondary | ICD-10-CM

## 2022-10-14 ENCOUNTER — Other Ambulatory Visit: Payer: Self-pay | Admitting: Family Medicine

## 2022-10-14 NOTE — Telephone Encounter (Signed)
Patient is requesting a refill of the following medications: Requested Prescriptions   Pending Prescriptions Disp Refills   meloxicam (MOBIC) 15 MG tablet [Pharmacy Med Name: MELOXICAM 15 MG TABLET] 90 tablet 0    Sig: TAKE 1 TABLET BY MOUTH EVERY DAY    Date of patient request: 10/14/22 Last office visit: 09/28/22 Date of last refill: 07/19/22 Last refill amount: 90

## 2022-10-23 ENCOUNTER — Other Ambulatory Visit: Payer: Self-pay | Admitting: Family Medicine

## 2022-10-23 DIAGNOSIS — M62830 Muscle spasm of back: Secondary | ICD-10-CM

## 2022-10-25 ENCOUNTER — Telehealth: Payer: Self-pay | Admitting: Family Medicine

## 2022-10-25 NOTE — Telephone Encounter (Signed)
THIS WAS SENT IN THIS MORNING

## 2022-10-25 NOTE — Telephone Encounter (Signed)
    Pt was called and Beverely Low has already sent in refill.

## 2022-12-18 ENCOUNTER — Other Ambulatory Visit: Payer: Self-pay | Admitting: Family Medicine

## 2022-12-18 DIAGNOSIS — M62830 Muscle spasm of back: Secondary | ICD-10-CM

## 2023-01-28 ENCOUNTER — Encounter: Payer: Self-pay | Admitting: Family Medicine

## 2023-01-28 ENCOUNTER — Ambulatory Visit: Payer: Medicare Other | Admitting: Family Medicine

## 2023-01-28 VITALS — BP 138/80 | HR 107 | Temp 98.9°F | Resp 18 | Ht 67.0 in | Wt 164.4 lb

## 2023-01-28 DIAGNOSIS — R300 Dysuria: Secondary | ICD-10-CM | POA: Diagnosis not present

## 2023-01-28 DIAGNOSIS — R350 Frequency of micturition: Secondary | ICD-10-CM | POA: Diagnosis not present

## 2023-01-28 LAB — POCT URINALYSIS DIPSTICK
Glucose, UA: NEGATIVE
Protein, UA: POSITIVE — AB
Spec Grav, UA: 1.02 (ref 1.010–1.025)
Urobilinogen, UA: 0.2 E.U./dL
pH, UA: 6 (ref 5.0–8.0)

## 2023-01-28 MED ORDER — CEPHALEXIN 500 MG PO CAPS: 500.0000 mg | ORAL_CAPSULE | Freq: Two times a day (BID) | ORAL | 0 refills | Status: AC

## 2023-01-28 NOTE — Patient Instructions (Addendum)
Follow up as needed or as scheduled START the Cephalexin twice daily Continue to drink LOTS of water Call with any questions or concerns Hang in there!!!

## 2023-01-28 NOTE — Progress Notes (Unsigned)
   Subjective:    Patient ID: Alison Anderson, female    DOB: January 12, 1956, 67 y.o.   MRN: [id number]  HPI Urinary frequency- pt has hx of similar.  Sxs started 3-4 days ago.  Has 'pulling' w/ urination.  No blood.  No fever or chills.  No N/V.   Review of Systems For ROS see HPI     Objective:   Physical Exam Vitals reviewed.  Constitutional:      General: She is not in acute distress.    Appearance: Normal appearance. She is well-developed. She is not ill-appearing.  Abdominal:     General: There is no distension.     Palpations: Abdomen is soft.     Tenderness: There is no abdominal tenderness (no suprapubic or CVA tenderness).  Skin:    General: Skin is warm and dry.  Neurological:     General: No focal deficit present.     Mental Status: She is alert and oriented to person, place, and time.  Psychiatric:        Mood and Affect: Mood normal.        Behavior: Behavior normal.        Thought Content: Thought content normal.           Assessment & Plan:  Urinary frequency- pt has hx of similar.  Sxs started 3-4 days ago.  Urine is suspicious for infxn.  Start abx and adjust tx based on cx results.  Pt expressed understanding and is in agreement w/ plan.

## 2023-01-31 LAB — URINE CULTURE
MICRO NUMBER:: 15223651
SPECIMEN QUALITY:: ADEQUATE

## 2023-02-03 ENCOUNTER — Ambulatory Visit (INDEPENDENT_AMBULATORY_CARE_PROVIDER_SITE_OTHER): Payer: Medicare Other | Admitting: *Deleted

## 2023-02-03 DIAGNOSIS — Z0000 Encounter for general adult medical examination without abnormal findings: Secondary | ICD-10-CM

## 2023-02-03 NOTE — Progress Notes (Signed)
Subjective:   Alison Anderson is a 67 y.o. female who presents for Medicare Annual (Subsequent) preventive examination.  Visit Complete: Virtual  I connected with  Alison Anderson on 02/03/23 by a audio enabled telemedicine application and verified that I am speaking with the correct person using two identifiers.  Patient Location: Home  Provider Location: Home Office  I discussed the limitations of evaluation and management by telemedicine. The patient expressed understanding and agreed to proceed.  Vital Signs: Unable to obtain new vitals due to this being a telehealth visit.  Review of Systems     Cardiac Risk Factors include: advanced age (>76men, >84 women);obesity (BMI >30kg/m2)     Objective:    Today's Vitals   There is no height or weight on file to calculate BMI.     02/03/2023    3:03 PM 01/21/2022    2:16 PM 11/16/2016    4:03 PM 02/24/2016    3:33 PM 10/27/2015    4:03 PM  Advanced Directives  Does Patient Have a Medical Advance Directive? Yes No No Yes No  Type of Advance Directive Living will      Does patient want to make changes to medical advance directive?    No - Patient declined   Copy of Healthcare Power of Attorney in Chart?    No - copy requested   Would patient like information on creating a medical advance directive?  No - Patient declined   No - patient declined information    Current Medications (verified) Outpatient Encounter Medications as of 02/03/2023  Medication Sig   Cranberry 50 MG CHEW Chew by mouth.   cyclobenzaprine (FLEXERIL) 10 MG tablet TAKE 1 TABLET BY MOUTH EVERY 8 HOURS AS NEEDED   meloxicam (MOBIC) 15 MG tablet TAKE 1 TABLET BY MOUTH EVERY DAY   pantoprazole (PROTONIX) 40 MG tablet Take 1 tablet (40 mg total) by mouth daily. Please schedule an office visit   No facility-administered encounter medications on file as of 02/03/2023.    Allergies (verified) Tramadol   History: Past Medical History:  Diagnosis Date   Allergy     Esophageal stenosis    GERD (gastroesophageal reflux disease)    Lumbar herniated disc    Menopause    at 48   Status post dilation of esophageal narrowing    Past Surgical History:  Procedure Laterality Date   back surgery  05/2016   herniated disc, pinched nerve    CHOLECYSTECTOMY     COLONOSCOPY     TOTAL ABDOMINAL HYSTERECTOMY     Family History  Problem Relation Age of Onset   Diabetes Mother    Heart disease Mother    Hypertension Mother    Diabetes Father        borderline   Cancer Maternal Grandmother        breast   Breast cancer Maternal Grandmother    Stroke Other        grandparent   Breast cancer Other        grandmother   Colon cancer Neg Hx    Colon polyps Neg Hx    Esophageal cancer Neg Hx    Rectal cancer Neg Hx    Stomach cancer Neg Hx    Social History   Socioeconomic History   Marital status: Married    Spouse name: Not on file   Number of children: Not on file   Years of education: Not on file   Highest education level: Not  on file  Occupational History   Not on file  Tobacco Use   Smoking status: Never   Smokeless tobacco: Never  Substance and Sexual Activity   Alcohol use: Yes    Alcohol/week: 0.0 standard drinks of alcohol    Comment: rarely   Drug use: No   Sexual activity: Not Currently  Other Topics Concern   Not on file  Social History Narrative   Lives with husband and daughter Baxter Hire   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  (02/03/2023)   Overall Financial Resource Strain (CARDIA)    Difficulty of Paying Living Expenses: Not hard at all  Food Insecurity: No Food Insecurity (02/03/2023)   Hunger Vital Sign    Worried About Running Out of Food in the Last Year: Never true    Ran Out of Food in the Last Year: Never true  Transportation Needs: No Transportation Needs (02/03/2023)   PRAPARE - Administrator, Civil Service (Medical): No     Lack of Transportation (Non-Medical): No  Physical Activity: Insufficiently Active (02/03/2023)   Exercise Vital Sign    Days of Exercise per Week: 3 days    Minutes of Exercise per Session: 30 min  Stress: No Stress Concern Present (02/03/2023)   [employer] of Occupational Health - Occupational Stress Questionnaire    Feeling of Stress : Not at all  Social Connections: Moderately Isolated (02/03/2023)   Social Connection and Isolation Panel [NHANES]    Frequency of Communication with Friends and Family: Three times a week    Frequency of Social Gatherings with Friends and Family: Twice a week    Attends Religious Services: Never    Database administrator or Organizations: No    Attends Engineer, structural: Never    Marital Status: Married    Tobacco Counseling Counseling given: Not Answered   Clinical Intake:  Pre-visit preparation completed: Yes  Pain : No/denies pain     Diabetes: No  How often do you need to have someone help you when you read instructions, pamphlets, or other written materials from your doctor or pharmacy?: 1 - Never  Interpreter Needed?: No  Information entered by :: Remi Haggard LPN   Activities of Daily Living    02/03/2023    3:08 PM  In your present state of health, do you have any difficulty performing the following activities:  Hearing? 0  Vision? 0  Difficulty concentrating or making decisions? 0  Walking or climbing stairs? 0  Dressing or bathing? 0  Doing errands, shopping? 0  Preparing Food and eating ? N  Using the Toilet? N  In the past six months, have you accidently leaked urine? N  Do you have problems with loss of bowel control? N  Managing your Medications? N  Managing your Finances? N  Housekeeping or managing your Housekeeping? N    Patient Care Team: Sheliah Hatch, MD as PCP - General  Indicate any recent Medical Services you may have received from other than [facility] providers in the past year  (date may be approximate).     Assessment:   This is a routine wellness examination for Alison Anderson.  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 0 0 0 0 0  Number falls in past yr: 0 0 0 0 0  Injury with Fall? 0 0 0 0 0  Risk for fall due to :  No Fall Risks No Fall Risks No Fall Risks   Follow up Falls evaluation completed;Education provided;Falls prevention discussed Falls evaluation completed Falls evaluation completed Falls evaluation completed Falls evaluation completed;Education provided    MEDICARE RISK AT HOME:  Medicare Risk at Home - 02/03/23 1507     Any stairs in or around the home? No    If so, are there any without handrails? No    Home free of loose throw rugs in walkways, pet beds, electrical cords, etc? Yes    Adequate lighting in your home to reduce risk of falls? Yes    Life alert? No    Grab bars in the bathroom? No    Shower chair or bench in shower? No    Elevated toilet seat or a handicapped toilet? No             TIMED UP AND GO:  Was the test performed?  No    Cognitive Function:        02/03/2023    3:11 PM  6CIT Screen  What Year? 0 points  What month? 0 points  What time? 0 points  Count back from 20 0 points  Months in reverse 0 points  Repeat phrase 0 points  Total  Score 0 points    Immunizations Immunization History  Administered Date(s) Administered   Influenza Inj Mdck Quad Pf 06/09/2016   Influenza, Seasonal, Injecte, Preservative Fre 05/28/2014   Influenza,inj,Quad PF,6+ Mos 05/12/2015, 05/04/2017, 04/20/2018, 03/30/2019, 04/29/2020   Influenza-Unspecified 05/28/2014, 03/31/2021, 06/20/2022   PFIZER Comirnaty(Gray Top)Covid-19 Tri-Sucrose Vaccine 10/01/2019, 10/22/2019, 04/29/2020   PFIZER(Purple Top)SARS-COV-2 Vaccination 03/31/2021   PNEUMOCOCCAL CONJUGATE-20 06/20/2022   Respiratory Syncytial Virus Vaccine,Recomb Aduvanted(Arexvy) 06/20/2022   Tdap 06/05/2014, 09/22/2020   Zoster Recombinant(Shingrix) 12/09/2020, 01/09/2021    TDAP status: Up to date  Flu Vaccine status: Up to date  Pneumococcal vaccine status: Up to date  Covid-19 vaccine status: Information provided on how to obtain vaccines.   Qualifies for Shingles Vaccine? No   Zostavax completed Yes   Shingrix Completed?: Yes  Screening Tests Health Maintenance  Topic Date Due   INFLUENZA VACCINE  02/10/2023   MAMMOGRAM  07/17/2023   Medicare Annual Wellness (AWV)  02/03/2024   Colonoscopy  12/04/2026   DTaP/Tdap/Td (3 - Td or Tdap) 09/23/2030   Pneumonia Vaccine 71+ Years old  Completed   DEXA SCAN  Completed   Zoster Vaccines- Shingrix  Completed   HPV VACCINES  Aged Out  COVID-19 Vaccine  Discontinued   Hepatitis C Screening  Discontinued    Health Maintenance  There are no preventive care reminders to display for this patient.   Colorectal cancer screening: Type of screening: Colonoscopy. Completed 2018. Repeat every 10 years  Mammogram status: Completed  . Repeat every year  Bone Density status: Completed 2023. Results reflect: Bone density results: OSTEOPENIA. Repeat every 3-5 years.  Lung Cancer Screening: (Low Dose CT Chest recommended if Age 62-80 years, 20 pack-year currently smoking OR have quit w/in 15years.) does not qualify.   Lung Cancer  Screening Referral:   Additional Screening:  Hepatitis C Screening  never done  Vision Screening: Recommended annual ophthalmology exams for early detection of glaucoma and other disorders of the eye. Is the patient up to date with their annual eye exam?  Yes  Who is the provider or what is the name of the office in which the patient attends annual eye exams? Unsure of name If pt is not established with a provider, would they like to be referred to a provider to establish care? No .   Dental Screening: Recommended annual dental exams for proper oral hygiene    Community Resource Referral / Chronic Care Management: CRR required this visit?  No   CCM required this visit?  No     Plan:     I have personally reviewed and noted the following in the patient's chart:   Medical and social history Use of alcohol, tobacco or illicit drugs  Current medications and supplements including opioid prescriptions. Patient is not currently taking opioid prescriptions. Functional ability and status Nutritional status Physical activity Advanced directives List of other physicians Hospitalizations, surgeries, and ER visits in previous 12 months Vitals Screenings to include cognitive, depression, and falls Referrals and appointments  In addition, I have reviewed and discussed with patient certain preventive protocols, quality metrics, and best practice recommendations. A written personalized care plan for preventive services as well as general preventive health recommendations were provided to patient.     Remi Haggard, LPN   1/61/0960   After Visit Summary: (MyChart) Due to this being a telephonic visit, the after visit summary with patients personalized plan was offered to patient via MyChart   Nurse Notes:

## 2023-02-03 NOTE — Patient Instructions (Signed)
Alison Anderson , Thank you for taking time to come for your Medicare Wellness Visit. I appreciate your ongoing commitment to your health goals. Please review the following plan we discussed and let me know if I can assist you in the future.   Screening recommendations/referrals: Colonoscopy: up to date Mammogram: up to date Bone Density: up to date Recommended yearly ophthalmology/optometry visit for glaucoma screening and checkup Recommended yearly dental visit for hygiene and checkup  Vaccinations: Influenza vaccine: up to date Pneumococcal vaccine: up to date Tdap vaccine: up to date Shingles vaccine: up to date    Advanced directives: Education provided      Preventive Care 65 Years and Older, Female Preventive care refers to lifestyle choices and visits with your health care provider that can promote health and wellness. What does preventive care include? A yearly physical exam. This is also called an annual well check. Dental exams once or twice a year. Routine eye exams. Ask your health care provider how often you should have your eyes checked. Personal lifestyle choices, including: Daily care of your teeth and gums. Regular physical activity. Eating a healthy diet. Avoiding tobacco and drug use. Limiting alcohol use. Practicing safe sex. Taking low-dose aspirin every day. Taking vitamin and mineral supplements as recommended by your health care provider. What happens during an annual well check? The services and screenings done by your health care provider during your annual well check will depend on your age, overall health, lifestyle risk factors, and family history of disease. Counseling  Your health care provider may ask you questions about your: Alcohol use. Tobacco use. Drug use. Emotional well-being. Home and relationship well-being. Sexual activity. Eating habits. History of falls. Memory and ability to understand (cognition). Work and work  Astronomer. Reproductive health. Screening  You may have the following tests or measurements: Height, weight, and BMI. Blood pressure. Lipid and cholesterol levels. These may be checked every 5 years, or more frequently if you are over 30 years old. Skin check. Lung cancer screening. You may have this screening every year starting at age 28 if you have a 30-pack-year history of smoking and currently smoke or have quit within the past 15 years. Fecal occult blood test (FOBT) of the stool. You may have this test every year starting at age 31. Flexible sigmoidoscopy or colonoscopy. You may have a sigmoidoscopy every 5 years or a colonoscopy every 10 years starting at age 87. Hepatitis C blood test. Hepatitis B blood test. Sexually transmitted disease (STD) testing. Diabetes screening. This is done by checking your blood sugar (glucose) after you have not eaten for a while (fasting). You may have this done every 1-3 years. Bone density scan. This is done to screen for osteoporosis. You may have this done starting at age 58. Mammogram. This may be done every 1-2 years. Talk to your health care provider about how often you should have regular mammograms. Talk with your health care provider about your test results, treatment options, and if necessary, the need for more tests. Vaccines  Your health care provider may recommend certain vaccines, such as: Influenza vaccine. This is recommended every year. Tetanus, diphtheria, and acellular pertussis (Tdap, Td) vaccine. You may need a Td booster every 10 years. Zoster vaccine. You may need this after age 47. Pneumococcal 13-valent conjugate (PCV13) vaccine. One dose is recommended after age 51. Pneumococcal polysaccharide (PPSV23) vaccine. One dose is recommended after age 28. Talk to your health care provider about which screenings and vaccines you need  and how often you need them. This information is not intended to replace advice given to you by  your health care provider. Make sure you discuss any questions you have with your health care provider. Document Released: 07/25/2015 Document Revised: 03/17/2016 Document Reviewed: 04/29/2015 Elsevier Interactive Patient Education  2017 [employer].  Fall Prevention in the Home Falls can cause injuries. They can happen to people of all ages. There are many things you can do to make your home safe and to help prevent falls. What can I do on the outside of my home? Regularly fix the edges of walkways and driveways and fix any cracks. Remove anything that might make you trip as you walk through a door, such as a raised step or threshold. Trim any bushes or trees on the path to your home. Use bright outdoor lighting. Clear any walking paths of anything that might make someone trip, such as rocks or tools. Regularly check to see if handrails are loose or broken. Make sure that both sides of any steps have handrails. Any raised decks and porches should have guardrails on the edges. Have any leaves, snow, or ice cleared regularly. Use sand or salt on walking paths during winter. Clean up any spills in your garage right away. This includes oil or grease spills. What can I do in the bathroom? Use night lights. Install grab bars by the toilet and in the tub and shower. Do not use towel bars as grab bars. Use non-skid mats or decals in the tub or shower. If you need to sit down in the shower, use a plastic, non-slip stool. Keep the floor dry. Clean up any water that spills on the floor as soon as it happens. Remove soap buildup in the tub or shower regularly. Attach bath mats securely with double-sided non-slip rug tape. Do not have throw rugs and other things on the floor that can make you trip. What can I do in the bedroom? Use night lights. Make sure that you have a light by your bed that is easy to reach. Do not use any sheets or blankets that are too big for your bed. They should not hang  down onto the floor. Have a firm chair that has side arms. You can use this for support while you get dressed. Do not have throw rugs and other things on the floor that can make you trip. What can I do in the kitchen? Clean up any spills right away. Avoid walking on wet floors. Keep items that you use a lot in easy-to-reach places. If you need to reach something above you, use a strong step stool that has a grab bar. Keep electrical cords out of the way. Do not use floor polish or wax that makes floors slippery. If you must use wax, use non-skid floor wax. Do not have throw rugs and other things on the floor that can make you trip. What can I do with my stairs? Do not leave any items on the stairs. Make sure that there are handrails on both sides of the stairs and use them. Fix handrails that are broken or loose. Make sure that handrails are as long as the stairways. Check any carpeting to make sure that it is firmly attached to the stairs. Fix any carpet that is loose or worn. Avoid having throw rugs at the top or bottom of the stairs. If you do have throw rugs, attach them to the floor with carpet tape. Make sure that you  have a light switch at the top of the stairs and the bottom of the stairs. If you do not have them, ask someone to add them for you. What else can I do to help prevent falls? Wear shoes that: Do not have high heels. Have rubber bottoms. Are comfortable and fit you well. Are closed at the toe. Do not wear sandals. If you use a stepladder: Make sure that it is fully opened. Do not climb a closed stepladder. Make sure that both sides of the stepladder are locked into place. Ask someone to hold it for you, if possible. Clearly mark and make sure that you can see: Any grab bars or handrails. First and last steps. Where the edge of each step is. Use tools that help you move around (mobility aids) if they are needed. These  include: Canes. Walkers. Scooters. Crutches. Turn on the lights when you go into a dark area. Replace any light bulbs as soon as they burn out. Set up your furniture so you have a clear path. Avoid moving your furniture around. If any of your floors are uneven, fix them. If there are any pets around you, be aware of where they are. Review your medicines with your doctor. Some medicines can make you feel dizzy. This can increase your chance of falling. Ask your doctor what other things that you can do to help prevent falls. This information is not intended to replace advice given to you by your health care provider. Make sure you discuss any questions you have with your health care provider. Document Released: 04/24/2009 Document Revised: 12/04/2015 Document Reviewed: 08/02/2014 Elsevier Interactive Patient Education  2017 [employer].

## 2023-02-12 ENCOUNTER — Other Ambulatory Visit: Payer: Self-pay | Admitting: Family Medicine

## 2023-02-12 DIAGNOSIS — M62830 Muscle spasm of back: Secondary | ICD-10-CM

## 2023-04-07 ENCOUNTER — Other Ambulatory Visit: Payer: Self-pay | Admitting: Family Medicine

## 2023-04-07 NOTE — Telephone Encounter (Signed)
Patient is requesting a refill of the following medications: Requested Prescriptions   Pending Prescriptions Disp Refills   meloxicam (MOBIC) 15 MG tablet [Pharmacy Med Name: MELOXICAM 15 MG TABLET] 90 tablet 0    Sig: TAKE 1 TABLET BY MOUTH EVERY DAY    Date of patient request: 04/07/23 Last office visit: 01/28/23 Date of last refill: 10/25/22 Last refill amount: 90 Follow up time period per chart: PRN

## 2023-04-10 ENCOUNTER — Other Ambulatory Visit: Payer: Self-pay | Admitting: Family Medicine

## 2023-04-10 DIAGNOSIS — M62830 Muscle spasm of back: Secondary | ICD-10-CM

## 2023-04-21 ENCOUNTER — Other Ambulatory Visit: Payer: Self-pay | Admitting: Family Medicine

## 2023-04-21 NOTE — Telephone Encounter (Signed)
Medication: Meloxicam 15 mg Directions: Take 1 tablet by mouth daily  Last given: 04/07/23 Number refills: 0 Last o/v: 01/28/23 (acute)  Follow up: Not scheduled  Labs: N/A

## 2023-04-25 ENCOUNTER — Telehealth: Payer: Self-pay | Admitting: Family Medicine

## 2023-04-25 NOTE — Telephone Encounter (Signed)
Encourage patient to contact the pharmacy for refills or they can request refills through [facility]   WHAT PHARMACY WOULD THEY LIKE THIS SENT TO:  [id number] - [place], [place] - [address]    MEDICATION NAME & DOSE: cyclobenzaprine (FLEXERIL) 10 MG tablet    NOTES/COMMENTS FROM PATIENT:      Front office please notify patient: It takes 48-72 hours to process rx refill requests Ask patient to call pharmacy to ensure rx is ready before heading there.

## 2023-04-25 NOTE — Telephone Encounter (Signed)
Looking at the prescription she takes 3 tablets a day so the script sent to pharmacy in August was a 30-day supply with 1 refill. She has 3 tablets left to get her by until another prescription can be sent to the pharmacy. Please clarify if I'm misunderstanding.

## 2023-04-25 NOTE — Telephone Encounter (Signed)
cyclobenzaprine (FLEXERIL) 10 MG tablet  Refilled last 02/14/2023 90 tablets with 1 refill Last office visit 09/28/2022  Spoke with pt and told her her she does has refill on file for this RX. Pt will call us back if any issues

## 2023-04-28 ENCOUNTER — Other Ambulatory Visit: Payer: Self-pay | Admitting: Family Medicine

## 2023-04-28 DIAGNOSIS — M62830 Muscle spasm of back: Secondary | ICD-10-CM

## 2023-04-28 NOTE — Telephone Encounter (Signed)
Patient is requesting a refill of the following medications: Requested Prescriptions   Pending Prescriptions Disp Refills   cyclobenzaprine (FLEXERIL) 10 MG tablet [Pharmacy Med Name: CYCLOBENZAPRINE 10 MG TABLET] 90 tablet 1    Sig: TAKE 1 TABLET BY MOUTH EVERY 8 HOURS AS NEEDED    Date of patient request: 04/28/23 Last office visit: 01/28/23 Date of last refill: 02/14/23 Last refill amount: 90 Follow up time period per chart: PRN

## 2023-05-20 ENCOUNTER — Ambulatory Visit: Payer: Medicare Other | Admitting: Family Medicine

## 2023-05-20 ENCOUNTER — Encounter: Payer: Self-pay | Admitting: Family Medicine

## 2023-05-20 VITALS — BP 138/80 | HR 106 | Temp 97.9°F | Ht 67.0 in | Wt 164.4 lb

## 2023-05-20 DIAGNOSIS — R399 Unspecified symptoms and signs involving the genitourinary system: Secondary | ICD-10-CM | POA: Diagnosis not present

## 2023-05-20 LAB — POC URINALSYSI DIPSTICK (AUTOMATED)
Bilirubin, UA: NEGATIVE
Glucose, UA: NEGATIVE
Ketones, UA: NEGATIVE
Nitrite, UA: NEGATIVE
Protein, UA: POSITIVE — AB
Spec Grav, UA: 1.015 (ref 1.010–1.025)
Urobilinogen, UA: 0.2 U/dL
pH, UA: 6 (ref 5.0–8.0)

## 2023-05-20 MED ORDER — CEPHALEXIN 500 MG PO CAPS: 500.0000 mg | ORAL_CAPSULE | Freq: Two times a day (BID) | ORAL | 0 refills | Status: AC

## 2023-05-20 NOTE — Patient Instructions (Signed)
Follow up as needed or as scheduled Start Cephalexin twice daily Drink LOTS of fluids Call with any questions or concerns Hang in there!

## 2023-05-20 NOTE — Progress Notes (Signed)
   Subjective:    Patient ID: Alison Anderson, female    DOB: [birth year], 67 y.o.   MRN: [id number]  HPI UTI sxs- sxs started a few days ago w/ 'that familiar pulling sensation' when urinating.  Increased frequency and urgency w/ minimal output.  Denies suprapubic pain or pressure.  No CVA TTP.   Review of Systems For ROS see HPI     Objective:   Physical Exam Vitals reviewed.  Constitutional:      General: She is not in acute distress.    Appearance: Normal appearance. She is well-developed. She is not ill-appearing.  Abdominal:     General: There is no distension.     Palpations: Abdomen is soft.     Tenderness: There is no abdominal tenderness (no suprapubic or CVA tenderness). There is no right CVA tenderness or left CVA tenderness.  Skin:    General: Skin is warm and dry.  Neurological:     General: No focal deficit present.     Mental Status: She is alert and oriented to person, place, and time.  Psychiatric:        Mood and Affect: Mood normal.        Behavior: Behavior normal.        Thought Content: Thought content normal.           Assessment & Plan:  UTI sxs- new.  Pt has hx of similar and this feels like previous infections.  UA is suspicious.  Start Keflex while awaiting culture results.

## 2023-05-22 LAB — URINE CULTURE
MICRO NUMBER:: 15706509
SPECIMEN QUALITY:: ADEQUATE

## 2023-05-23 ENCOUNTER — Telehealth: Payer: Self-pay

## 2023-05-23 NOTE — Telephone Encounter (Signed)
Spoke to patient she is aware of her culture results. She had no questions and stated she was feeling better

## 2023-05-23 NOTE — Telephone Encounter (Signed)
-----   Message from Neena Rhymes sent at 05/23/2023  7:23 AM EST ----- UTI is being treated appropriately- great news!

## 2023-06-16 ENCOUNTER — Ambulatory Visit (INDEPENDENT_AMBULATORY_CARE_PROVIDER_SITE_OTHER): Payer: Medicare Other | Admitting: Family Medicine

## 2023-06-16 ENCOUNTER — Encounter: Payer: Self-pay | Admitting: Family Medicine

## 2023-06-16 VITALS — BP 112/68 | HR 110 | Temp 97.8°F | Ht 67.0 in | Wt 162.0 lb

## 2023-06-16 DIAGNOSIS — N3001 Acute cystitis with hematuria: Secondary | ICD-10-CM | POA: Diagnosis not present

## 2023-06-16 LAB — POCT URINALYSIS DIPSTICK
Bilirubin, UA: NEGATIVE
Glucose, UA: NEGATIVE
Ketones, UA: NEGATIVE
Nitrite, UA: NEGATIVE
Protein, UA: POSITIVE — AB
Spec Grav, UA: 1.02 (ref 1.010–1.025)
Urobilinogen, UA: 0.2 U/dL
pH, UA: 6 (ref 5.0–8.0)

## 2023-06-16 MED ORDER — CEPHALEXIN 500 MG PO CAPS: 500.0000 mg | ORAL_CAPSULE | Freq: Two times a day (BID) | ORAL | 0 refills | Status: AC

## 2023-06-16 NOTE — Progress Notes (Signed)
   Subjective:    Patient ID: Alison Anderson, female    DOB: September 17, 1955, 67 y.o.   MRN: [id number]  HPI Urinary frequency- pt was seen 11/8 and treated for UTI.  Reports sxs cleared but a week after treatment sxs returned.  Some burning with urination and pulling at the end of voiding.  + urgency.  No fevers or chills.   Review of Systems For ROS see HPI     Objective:   Physical Exam Vitals reviewed.  Constitutional:      General: She is not in acute distress.    Appearance: Normal appearance. She is well-developed. She is not ill-appearing.  Abdominal:     General: There is no distension.     Palpations: Abdomen is soft.     Tenderness: There is no abdominal tenderness (no suprapubic or CVA tenderness).  Skin:    General: Skin is warm and dry.  Neurological:     General: No focal deficit present.     Mental Status: She is alert and oriented to person, place, and time.  Psychiatric:        Mood and Affect: Mood normal.        Behavior: Behavior normal.        Thought Content: Thought content normal.           Assessment & Plan:  Acute cystitis- pt has hx of similar.  Start Keflex but increase course to 7 days.  Reviewed supportive care and red flags that should prompt return.  Pt expressed understanding and is in agreement w/ plan.

## 2023-06-16 NOTE — Patient Instructions (Signed)
Follow up as needed or as scheduled START the Cephalexin twice daily x7 days Drink LOTS of fluids Call with any questions or concerns Stay Safe!  Stay Healthy! Happy Holidays!!!

## 2023-06-18 LAB — URINE CULTURE
MICRO NUMBER:: 15813762
SPECIMEN QUALITY:: ADEQUATE

## 2023-06-20 ENCOUNTER — Telehealth: Payer: Self-pay

## 2023-06-20 NOTE — Telephone Encounter (Signed)
-----   Message from Neena Rhymes sent at 06/19/2023 10:52 AM EST ----- Your UTI is being treated appropriately- great news!

## 2023-06-20 NOTE — Telephone Encounter (Signed)
Pt has been notified.

## 2023-06-27 ENCOUNTER — Other Ambulatory Visit: Payer: Self-pay | Admitting: Family Medicine

## 2023-06-27 DIAGNOSIS — M62830 Muscle spasm of back: Secondary | ICD-10-CM

## 2023-06-28 NOTE — Telephone Encounter (Signed)
Requested Prescriptions   Pending Prescriptions Disp Refills   cyclobenzaprine (FLEXERIL) 10 MG tablet [Pharmacy Med Name: CYCLOBENZAPRINE 10 MG TABLET] 90 tablet 1    Sig: TAKE 1 TABLET BY MOUTH EVERY 8 HOURS AS NEEDED     Date of patient request: 06/28/2023 Last office visit: 06/16/2023 Upcoming visit: Visit date not found Date of last refill: 04/29/2023 Last refill amount: 90x1

## 2023-08-07 ENCOUNTER — Other Ambulatory Visit: Payer: Self-pay | Admitting: Family Medicine

## 2023-08-31 ENCOUNTER — Telehealth: Payer: Self-pay

## 2023-08-31 ENCOUNTER — Telehealth: Payer: Self-pay | Admitting: Gastroenterology

## 2023-08-31 NOTE — Telephone Encounter (Signed)
 Pt states she has IBS and she has been using imodium prior to gym.  She is asking, if this is okay to take 2-3x weekly? Please advise.

## 2023-08-31 NOTE — Telephone Encounter (Signed)
 Pt has been notified.

## 2023-08-31 NOTE — Telephone Encounter (Signed)
 Ok to take Imodium prior to gym unless GI has other advice

## 2023-08-31 NOTE — Telephone Encounter (Signed)
 Spoke with the patient. She is having less than 4 stools in a day. She has random spells of loose stools on some days. She will take Imodium when she is going out of the house "just in case." Denies fever, pain or nausea. No recent antibiotics or sick exposures.

## 2023-08-31 NOTE — Telephone Encounter (Signed)
 Sent to PCP ?

## 2023-09-02 NOTE — Telephone Encounter (Signed)
 Agree, continue high fiber diet. F/u in office visit next available. Thanks

## 2023-09-05 ENCOUNTER — Other Ambulatory Visit: Payer: Self-pay | Admitting: Family Medicine

## 2023-09-05 DIAGNOSIS — M62830 Muscle spasm of back: Secondary | ICD-10-CM

## 2023-09-05 NOTE — Telephone Encounter (Signed)
 Line rings then goes to fast busy

## 2023-10-29 ENCOUNTER — Other Ambulatory Visit: Payer: Self-pay | Admitting: Gastroenterology

## 2023-11-07 ENCOUNTER — Other Ambulatory Visit: Payer: Self-pay | Admitting: Family Medicine

## 2023-11-07 DIAGNOSIS — M62830 Muscle spasm of back: Secondary | ICD-10-CM

## 2023-11-11 ENCOUNTER — Telehealth: Payer: Self-pay | Admitting: Family Medicine

## 2023-11-11 ENCOUNTER — Other Ambulatory Visit: Payer: Self-pay | Admitting: Family Medicine

## 2023-11-11 DIAGNOSIS — M62830 Muscle spasm of back: Secondary | ICD-10-CM

## 2023-11-11 NOTE — Telephone Encounter (Signed)
 Saw this was refused previously, wanted to be sure this was correct

## 2023-11-11 NOTE — Telephone Encounter (Signed)
 This RN made courtesy call to patient to inform that requested medication has been refilled. Patient verbalized understanding.   Copied from CRM [id number]. Topic: Clinical - Medication Question >> Nov 11, 2023 11:04 AM Allyne Areola wrote: Reason for CRM: Patient is calling because the pharmacy sent a refill request for cyclobenzaprine  (FLEXERIL ) 10 MG tablet on Monday 11/07/2023 and they have not gotten a response. She took her last pill this morning and does not want to go the weekend without it.

## 2023-11-11 NOTE — Telephone Encounter (Signed)
 Spoke to patient and let her know that the rx had been sent in

## 2023-11-14 ENCOUNTER — Ambulatory Visit: Payer: Self-pay

## 2023-11-14 ENCOUNTER — Telehealth: Payer: Self-pay

## 2023-11-14 NOTE — Telephone Encounter (Signed)
 Since it was a stray cat- and unlikely to be vaccinated- if the cat cannot be caught and held for quarantine, she needs to go to ER and start rabies vaccines.  Rabies is 100% fatal.  She also needs to make sure Tdap is UTD.  And lastly, cat bites- particularly of the hand- tend to get easily and quickly infected.  She should be seen to ensure no antibiotics are needed.

## 2023-11-14 NOTE — Telephone Encounter (Signed)
 Pt was advised to go to the ED for cat bite, declined this option I will call and make an appointment with the office anything else to advise in the meantime?

## 2023-11-14 NOTE — Telephone Encounter (Signed)
 Copied from CRM [id number]. Topic: General - Other >> Nov 14, 2023 11:35 AM Bambi Bonine D wrote: Reason for CRM: Nurse Candace stated that the patient refused to go to an ER due to a stray cat bite. Nurse Candace stated that animal control has been contacted and a patient stated that she does not have $50-100 to to spend everyday and refused to go into ER.

## 2023-11-14 NOTE — Telephone Encounter (Signed)
 Called patient to relay Dr.Tabori's note, Patient stated they are taking the cat to the shelter for a 10 day hold to be watched for rabies, Patient has had a Tdap 09/2020, Patient cleaned area on forearm where the cat did bite her with soap and water. Patient wil reach out if anything seems wrong or unusual she states.

## 2023-11-14 NOTE — Telephone Encounter (Signed)
 FYI

## 2023-11-14 NOTE — Telephone Encounter (Signed)
 Copied from CRM [id number]. Topic: Clinical - Red Word Triage >> Nov 14, 2023 11:24 AM Annelle Kiel wrote: Red Word that prompted transfer to Nurse Triage: patient was bitten by a stray cat the cat has not had  any shots or vaccines  Chief Complaint: cat bite to forearm Symptoms: cat bite Frequency: happened yesterday. Pertinent Negatives: Patient denies bleeding, fever Disposition: [] ED /[] Urgent Care (no appt availability in office) / [] Appointment(In office/virtual)/ []  [place] [facility]/ [] Home Care/ [x] Refused Recommended Disposition /[] [place] [facility]/ []  Follow-up with PCP Additional Notes: declined ER visit; pcp office called and updated.  Reason for Disposition  [1] Any break in skin from BITE (e.g., cut, puncture or scratch) AND[2] PET animal (e.g., dog, cat, or ferret) at risk for RABIES (e.g., sick, stray, unprovoked bite, developing country)  Answer Assessment - Initial Assessment Questions 1. ANIMAL: "What type of animal caused the bite?" "Is the injury from a bite or a claw?" If the animal is a dog or a cat, ask: "Was it a pet or a stray?" "Was it acting ill or behaving strangely?"     cat 2. LOCATION: "Where is the bite located?"      Right forearm, past wrist joint 3. SIZE: "How big is the bite?" "What does it look like?"      Pretty deep 4. ONSET: "When did the bite happen?" (Minutes or hours ago)      Yesterday 5. CIRCUMSTANCES: "Tell me how this happened."      Leaned on her tail and cat bite her 6. TETANUS: "When was your last tetanus booster?"     States unknown 7. RABIES VACCINE: For dog or cat bites, ask: "Do you know if the pet is vaccinated against rabies?"  (e.g., yes, no, overdue for rabies shot, unknown)     No vaccines 8. PREGNANCY: "Is there any chance you are pregnant?" "When was your last menstrual period?"     na  Protocols used: Animal Bite-A-AH

## 2023-11-14 NOTE — Telephone Encounter (Signed)
Attempted to call patient to schedule - no answer

## 2023-11-22 ENCOUNTER — Encounter: Payer: Self-pay | Admitting: Gastroenterology

## 2023-11-22 ENCOUNTER — Ambulatory Visit (INDEPENDENT_AMBULATORY_CARE_PROVIDER_SITE_OTHER): Payer: Medicare Other | Admitting: Gastroenterology

## 2023-11-22 VITALS — BP 134/84 | HR 99 | Ht 67.0 in | Wt 153.0 lb

## 2023-11-22 DIAGNOSIS — Z8719 Personal history of other diseases of the digestive system: Secondary | ICD-10-CM

## 2023-11-22 DIAGNOSIS — K219 Gastro-esophageal reflux disease without esophagitis: Secondary | ICD-10-CM

## 2023-11-22 DIAGNOSIS — E559 Vitamin D deficiency, unspecified: Secondary | ICD-10-CM

## 2023-11-22 MED ORDER — PANTOPRAZOLE SODIUM 40 MG PO TBEC: 40.0000 mg | DELAYED_RELEASE_TABLET | Freq: Every day | ORAL | 3 refills | Status: DC

## 2023-11-22 NOTE — Progress Notes (Signed)
 Alison Anderson    [id number]    June 02, 1956  Primary Care Physician:Alison Anderson, Alison Griffon, MD  Referring Physician: Jess Morita, MD [phone]  [place] [address]   Chief complaint:  GERD  Discussed the use of AI scribe software for clinical note transcription with the patient, who gave verbal consent to proceed.  History of Present Illness Alison Anderson is a 68 year old female with esophageal dilation who presents for follow-up regarding swallowing difficulties and acid reflux management.  She has experienced only one episode of dysphagia since her last visit, attributed to eating a larger sandwich than usual and being in a hurry. This was an isolated incident, and she has not had any other swallowing issues in the past year. Her symptoms have significantly improved since undergoing esophageal dilation.  She is currently taking pantoprazole  40 mg once daily for acid reflux. She has experienced indigestion only twice since her last visit, which she managed with Pepto chewables. A previous attempt to lower the dose of pantoprazole  resulted in increased symptoms, so she continues with the current dosage. No frequent swallowing difficulties or significant indigestion beyond the isolated incidents mentioned.  She has not had her vitamin D  and B12 levels checked since 2017, at which time her vitamin D  was borderline low. She plans to have these levels re-evaluated during her next annual labs.  GI Hx:    EGD 02/11/21 - The Z-line was regular and was found 38 cm from the incisors. - One benign-appearing, intrinsic mild stenosis was found 37 to 38 cm from the incisors. This stenosis measured less than one cm (in length). The stenosis was traversed. The scope was withdrawn. Dilation was performed with a Maloney dilator with no resistance at 50 Fr and mild resistance at 54 Fr. The dilation site was examined following endoscope reinsertion and showed mild  mucosal disruption. - Normal mucosa was found in the entire esophagus. Biopsies were obtained from the proximal and distal esophagus with cold forceps for histology of suspected eosinophilic esophagitis. - The stomach was normal. - The cardia and gastric fundus were normal on retroflexion. - The examined duodenum was normal.   Colonoscopy 11/30/2016 Internal hemorrhoids otherwise normal exam. Recall colonoscopy in 10 years    Outpatient Encounter Medications as of 11/22/2023  Medication Sig   Cranberry 50 MG CHEW Chew by mouth.   cyclobenzaprine  (FLEXERIL ) 10 MG tablet TAKE 1 TABLET BY MOUTH EVERY 8 HOURS AS NEEDED   meloxicam  (MOBIC ) 15 MG tablet TAKE 1 TABLET BY MOUTH EVERY DAY   pantoprazole  (PROTONIX ) 40 MG tablet TAKE 1 TABLET (40 MG TOTAL) BY MOUTH DAILY. PLEASE SCHEDULE AN OFFICE VISIT   No facility-administered encounter medications on file as of 11/22/2023.    Allergies as of 11/22/2023 - Review Complete 06/16/2023  Allergen Reaction Noted   Tramadol  Itching and Rash 02/19/2011    Past Medical History:  Diagnosis Date   Allergy    Esophageal stenosis    GERD (gastroesophageal reflux disease)    Lumbar herniated disc    Menopause    at 48   Status post dilation of esophageal narrowing     Past Surgical History:  Procedure Laterality Date   back surgery  05/2016   herniated disc, pinched nerve    CHOLECYSTECTOMY     COLONOSCOPY     TOTAL ABDOMINAL HYSTERECTOMY      Family History  Problem Relation Age of  Onset   Diabetes Mother    Heart disease Mother    Hypertension Mother    Diabetes Father        borderline   Cancer Maternal Grandmother        breast   Breast cancer Maternal Grandmother    Stroke Other        grandparent   Breast cancer Other        grandmother   Colon cancer Neg Hx    Colon polyps Neg Hx    Esophageal cancer Neg Hx    Rectal cancer Neg Hx    Stomach cancer Neg Hx     Social History   Socioeconomic History   Marital  status: Married    Spouse name: Not on file   Number of children: Not on file   Years of education: Not on file   Highest education level: Not on file  Occupational History   Not on file  Tobacco Use   Smoking status: Never   Smokeless tobacco: Never  Substance and Sexual Activity   Alcohol use: Yes    Alcohol/week: 0.0 standard drinks of alcohol    Comment: rarely   Drug use: No   Sexual activity: Not Currently  Other Topics Concern   Not on file  Social History Narrative   Lives with husband and daughter Starling Eck   Works as a Midwife at [employer]: 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  (02/03/2023)   Overall Financial Resource Strain (CARDIA)    Difficulty of Paying Living Expenses: Not hard at all  Food Insecurity: No Food Insecurity (02/03/2023)   Hunger Vital Sign    Worried About Running Out of Food in the Last Year: Never true    Ran Out of Food in the Last Year: Never true  Transportation Needs: No Transportation Needs (02/03/2023)   PRAPARE - Administrator, Civil Service (Medical): No    Lack of Transportation (Non-Medical): No  Physical Activity: Insufficiently Active (02/03/2023)   Exercise Vital Sign    Days of Exercise per Week: 3 days    Minutes of Exercise per Session: 30 min  Stress: No Stress Concern Present (02/03/2023)   [employer] of Occupational Health - Occupational Stress Questionnaire    Feeling of Stress : Not at all  Social Connections: Moderately Isolated (02/03/2023)   Social Connection and Isolation Panel [NHANES]    Frequency of Communication with Friends and Family: Three times a week    Frequency of Social Gatherings with Friends and Family: Twice a week    Attends Religious Services: Never    Database administrator or Organizations: No    Attends Banker Meetings: Never    Marital Status: Married  Catering manager Violence: Not At Risk (02/03/2023)   Humiliation, Afraid,  Rape, and Kick questionnaire    Fear of Current or Ex-Partner: No    Emotionally Abused: No    Physically Abused: No    Sexually Abused: No      Review of systems: All other review of systems negative except as mentioned in the HPI.   Physical Exam: Vitals:   11/22/23 0947  BP: 134/84  Pulse: 99   Body mass index is 23.96 kg/m. Gen:      No acute distress HEENT:  sclera anicteric CV: s1s2 rrr, no murmur Lungs: B/l clear. Abd:      soft, non-tender; no palpable masses, no distension Ext:  No edema Neuro: alert and oriented x 3 Psych: normal mood and affect  Data Reviewed:  Reviewed labs, radiology imaging, old records and pertinent past GI work up     Assessment and Plan Assessment & Plan Gastroesophageal reflux disease (GERD) GERD is well-controlled with pantoprazole  40 mg once daily. She experienced only two episodes of indigestion since the last visit, managed with Pepto chewables. Previous attempt to lower pantoprazole  dose resulted in increased symptoms, indicating the current dose is necessary. Long-term use of pantoprazole  may lead to decreased absorption of essential vitamins and minerals, particularly vitamin D  and B12. The goal is to maintain the lowest effective dose to manage symptoms and prevent complications such as irritation in scar tissue, which could necessitate repeat procedures. - Continue pantoprazole  40 mg once daily. - Recommend a multivitamin, specifically a senior formula like One A Day 50+. - Start vitamin D  supplementation with 2000 IU daily, as previous levels were borderline low. - Check vitamin D  and B12 levels during annual labs with primary care provider. - Provide refills for pantoprazole . - Transfer prescription management of pantoprazole  to primary care provider given her symptoms are stable.  Esophageal dilation No significant issues with swallowing since the esophageal dilation, with only one episode of difficulty attributed to  eating too quickly. No need for repeat dilation unless symptoms recur or worsen. - Monitor for recurrence or worsening of swallowing difficulties.  Average risk for colorectal cancer screening, due for surveillance colonoscopy in 2028  Return as needed  The patient was provided an opportunity to ask questions and all were answered. The patient agreed with the plan and demonstrated an understanding of the instructions.  Lorena Rolling , MD    CC: Alison Anderson, Katherine E, MD

## 2023-11-22 NOTE — Patient Instructions (Addendum)
 VISIT SUMMARY:  During your visit, we discussed your ongoing management of swallowing difficulties and acid reflux. You reported significant improvement in your symptoms since your esophageal dilation, with only one isolated incident of swallowing difficulty. Your acid reflux is well-controlled with your current medication, and we reviewed your vitamin levels and supplementation needs.  YOUR PLAN:  -GASTROESOPHAGEAL REFLUX DISEASE (GERD): GERD is a condition where stomach acid frequently flows back into the tube connecting your mouth and stomach (esophagus). Your symptoms are well-controlled with pantoprazole  40 mg once daily. You had only two episodes of indigestion since your last visit, which you managed with Pepto chewables. We recommend continuing your current dose of pantoprazole , starting a multivitamin like One A Day 50+, and taking 2000 IU of vitamin D  daily. Please request your PMD to check your vitamin D  and B12 levels during your annual labs. If your symptoms remain well-controlled, your primary care provider can manage your prescription refills.  -ESOPHAGEAL DILATION: Esophageal dilation is a procedure to widen the esophagus. You have not had significant issues with swallowing since your dilation, with only one episode of difficulty due to eating too quickly. There is no need for repeat dilation unless your symptoms recur or worsen. We will monitor for any recurrence or worsening of swallowing difficulties.  INSTRUCTIONS:  Please continue taking pantoprazole  40 mg once daily and start taking a multivitamin like One A Day 50+ and 2000 IU of vitamin D  daily. Make sure to have your vitamin D  and B12 levels checked during your annual labs with your primary care provider. If your symptoms remain well-controlled, your primary care provider can manage your prescription refills. Monitor for any recurrence or worsening of swallowing difficulties and report any changes.  I appreciate the   opportunity to care for you  Thank You   Kavitha Nandigam , MD

## 2023-12-30 ENCOUNTER — Other Ambulatory Visit: Payer: Self-pay | Admitting: Family Medicine

## 2023-12-30 MED ORDER — PANTOPRAZOLE SODIUM 40 MG PO TBEC: 40.0000 mg | DELAYED_RELEASE_TABLET | Freq: Every day | ORAL | 3 refills | Status: AC

## 2023-12-30 NOTE — Telephone Encounter (Signed)
 Called patient to let her know that her GI doctor sent her pantoprazole  in and she had 3 refills on it. Once she needed more refills she would need to call us  so that Dr. Paulla Bossier can send it in and that's when we can change the provider on the medication. I did review the GI note and they did request that her PCP take over

## 2023-12-30 NOTE — Telephone Encounter (Signed)
 Copied from CRM [id number]. Topic: Clinical - Prescription Issue >> Dec 30, 2023 12:16 PM Melissa C wrote: Reason for CRM: patient currently takes pantoprazole  (PROTONIX ) 40 MG tablet and it has been prescribed by her GI doctor, however her GI doctor said she is doing well but still needs to be on medication and advised she have medication switched to PCP (as stated in note from GI doctor). She was wondering if this could officially be changed in the system so that when she calls the pharmacy for refills for prescription everything will be in order.

## 2024-01-07 ENCOUNTER — Other Ambulatory Visit: Payer: Self-pay | Admitting: Family Medicine

## 2024-01-07 DIAGNOSIS — M62830 Muscle spasm of back: Secondary | ICD-10-CM

## 2024-01-09 NOTE — Telephone Encounter (Signed)
 Requested Prescriptions   Pending Prescriptions Disp Refills   cyclobenzaprine  (FLEXERIL ) 10 MG tablet [Pharmacy Med Name: CYCLOBENZAPRINE  10 MG TABLET] 90 tablet 1    Sig: TAKE 1 TABLET BY MOUTH EVERY 8 HOURS AS NEEDED     Date of patient request: 01/09/2024 Last office visit: 06/16/2023 Upcoming visit: Visit date not found Date of last refill: 11/11/2023 Last refill amount: 90

## 2024-01-27 ENCOUNTER — Encounter: Payer: Self-pay | Admitting: Advanced Practice Midwife

## 2024-02-02 ENCOUNTER — Other Ambulatory Visit: Payer: Self-pay | Admitting: Family Medicine

## 2024-02-02 NOTE — Telephone Encounter (Signed)
 Requested Prescriptions   Pending Prescriptions Disp Refills   meloxicam  (MOBIC ) 15 MG tablet [Pharmacy Med Name: MELOXICAM  15 MG TABLET] 90 tablet 0    Sig: TAKE 1 TABLET BY MOUTH EVERY DAY     Date of patient request: 02/02/2024 Last office visit: 06/16/2023 Upcoming visit: Visit date not found Date of last refill: 11/07/2023 Last refill amount: 90

## 2024-03-05 ENCOUNTER — Other Ambulatory Visit: Payer: Self-pay | Admitting: Family Medicine

## 2024-03-05 DIAGNOSIS — Z1231 Encounter for screening mammogram for malignant neoplasm of breast: Secondary | ICD-10-CM

## 2024-03-10 ENCOUNTER — Other Ambulatory Visit: Payer: Self-pay | Admitting: Family Medicine

## 2024-03-10 DIAGNOSIS — M62830 Muscle spasm of back: Secondary | ICD-10-CM

## 2024-03-19 ENCOUNTER — Ambulatory Visit
Admission: RE | Admit: 2024-03-19 | Discharge: 2024-03-19 | Disposition: A | Discharge status: Home / Self Care | Source: Ambulatory Visit | Attending: Family Medicine | Admitting: Family Medicine

## 2024-03-19 DIAGNOSIS — Z1231 Encounter for screening mammogram for malignant neoplasm of breast: Secondary | ICD-10-CM

## 2024-05-04 ENCOUNTER — Other Ambulatory Visit: Payer: Self-pay | Admitting: Family Medicine

## 2024-05-04 DIAGNOSIS — M62830 Muscle spasm of back: Secondary | ICD-10-CM

## 2024-05-07 ENCOUNTER — Encounter: Payer: Self-pay | Admitting: Family Medicine

## 2024-05-07 ENCOUNTER — Ambulatory Visit: Admitting: Family Medicine

## 2024-05-07 ENCOUNTER — Telehealth: Payer: Self-pay | Admitting: Family Medicine

## 2024-05-07 VITALS — BP 142/80 | HR 100 | Temp 98.5°F | Resp 14 | Ht 67.0 in | Wt 152.0 lb

## 2024-05-07 DIAGNOSIS — E785 Hyperlipidemia, unspecified: Secondary | ICD-10-CM | POA: Insufficient documentation

## 2024-05-07 DIAGNOSIS — M5441 Lumbago with sciatica, right side: Secondary | ICD-10-CM | POA: Diagnosis not present

## 2024-05-07 DIAGNOSIS — R030 Elevated blood-pressure reading, without diagnosis of hypertension: Secondary | ICD-10-CM | POA: Diagnosis not present

## 2024-05-07 DIAGNOSIS — M62830 Muscle spasm of back: Secondary | ICD-10-CM

## 2024-05-07 DIAGNOSIS — G8929 Other chronic pain: Secondary | ICD-10-CM

## 2024-05-07 LAB — CBC WITH DIFFERENTIAL/PLATELET
Basophils Absolute: 0.1 K/uL (ref 0.0–0.1)
Basophils Relative: 0.8 % (ref 0.0–3.0)
Eosinophils Absolute: 0.3 K/uL (ref 0.0–0.7)
Eosinophils Relative: 4 % (ref 0.0–5.0)
HCT: 36.4 % (ref 36.0–46.0)
Hemoglobin: 11.8 g/dL — ABNORMAL LOW (ref 12.0–15.0)
Lymphocytes Relative: 40 % (ref 12.0–46.0)
Lymphs Abs: 3.4 K/uL (ref 0.7–4.0)
MCHC: 32.5 g/dL (ref 30.0–36.0)
MCV: 91.6 fl (ref 78.0–100.0)
Monocytes Absolute: 0.5 K/uL (ref 0.1–1.0)
Monocytes Relative: 5.4 % (ref 3.0–12.0)
Neutro Abs: 4.3 K/uL (ref 1.4–7.7)
Neutrophils Relative %: 49.8 % (ref 43.0–77.0)
Platelets: 283 K/uL (ref 150.0–400.0)
RBC: 3.98 Mil/uL (ref 3.87–5.11)
RDW: 14.9 % (ref 11.5–15.5)
WBC: 8.6 K/uL (ref 4.0–10.5)

## 2024-05-07 LAB — TSH: TSH: 2.21 u[IU]/mL (ref 0.35–5.50)

## 2024-05-07 LAB — BASIC METABOLIC PANEL WITH GFR
BUN: 26 mg/dL — ABNORMAL HIGH (ref 6–23)
CO2: 30 meq/L (ref 19–32)
Calcium: 9.4 mg/dL (ref 8.4–10.5)
Chloride: 103 meq/L (ref 96–112)
Creatinine, Ser: 1.23 mg/dL — ABNORMAL HIGH (ref 0.40–1.20)
GFR: 45.53 mL/min — ABNORMAL LOW (ref >60.00)
Glucose, Bld: 99 mg/dL (ref 70–99)
Potassium: 4.9 meq/L (ref 3.5–5.1)
Sodium: 139 meq/L (ref 135–145)

## 2024-05-07 LAB — LIPID PANEL
Cholesterol: 195 mg/dL (ref 0–200)
HDL: 79.7 mg/dL (ref >39.00)
LDL Cholesterol: 98 mg/dL (ref 0–99)
NonHDL: 115.12
Total CHOL/HDL Ratio: 2
Triglycerides: 84 mg/dL (ref 0.0–149.0)
VLDL: 16.8 mg/dL (ref 0.0–40.0)

## 2024-05-07 LAB — HEPATIC FUNCTION PANEL
ALT: 11 U/L (ref 0–35)
AST: 19 U/L (ref 0–37)
Albumin: 4.5 g/dL (ref 3.5–5.2)
Alkaline Phosphatase: 52 U/L (ref 39–117)
Bilirubin, Direct: 0.1 mg/dL (ref 0.0–0.3)
Total Bilirubin: 0.3 mg/dL (ref 0.2–1.2)
Total Protein: 7.1 g/dL (ref 6.0–8.3)

## 2024-05-07 MED ORDER — CYCLOBENZAPRINE HCL 10 MG PO TABS: 10.0000 mg | ORAL_TABLET | Freq: Three times a day (TID) | ORAL | 3 refills | Status: AC

## 2024-05-07 NOTE — Telephone Encounter (Unsigned)
 Copied from CRM [id number]. Topic: Clinical - Medication Refill >> May 07, 2024  3:18 PM Suzen RAMAN wrote: Medication: meloxicam  (MOBIC ) 15 MG tablet  Has the patient contacted their pharmacy? Yes   This is the patient's preferred pharmacy:  [facility] [address], [place] - [address]  [address] Phone: [phone] Fax: [phone]  Is this the correct pharmacy for this prescription? Yes If no, delete pharmacy and type the correct one.   Has the prescription been filled recently? No  Is the patient out of the medication? Yes  Has the patient been seen for an appointment in the last year OR does the patient have an upcoming appointment? Yes  Can we respond through MyChart? Yes  Agent: Please be advised that Rx refills may take up to 3 business days. We ask that you follow-up with your pharmacy.

## 2024-05-07 NOTE — Assessment & Plan Note (Signed)
 Chronic problem.  Takes Flexeril  regularly.  Refill provided.

## 2024-05-07 NOTE — Progress Notes (Signed)
   Subjective:    Patient ID: Alison Anderson, female    DOB: 1956-05-02, 68 y.o.   MRN: [id number]  HPI LBP- ongoing issue.  Currently on Flexeril  TID.    Elevated BP- pt doesn't have a hx of elevated BP.  Admits she is has been under some stress.  Denies CP, SOB, HA's, visual changes, edema.  Hyperlipidemia- ongoing issue.  Attempting to control w/ diet and exercise.  Denies abd pain, N/V.   Review of Systems For ROS see HPI     Objective:   Physical Exam Vitals reviewed.  Constitutional:      General: She is not in acute distress.    Appearance: Normal appearance. She is well-developed. She is not ill-appearing.  HENT:     Head: Normocephalic and atraumatic.  Eyes:     Conjunctiva/sclera: Conjunctivae normal.     Pupils: Pupils are equal, round, and reactive to light.  Neck:     Thyroid: No thyromegaly.  Cardiovascular:     Rate and Rhythm: Normal rate and regular rhythm.     Pulses: Normal pulses.     Heart sounds: Normal heart sounds. No murmur heard. Pulmonary:     Effort: Pulmonary effort is normal. No respiratory distress.     Breath sounds: Normal breath sounds.  Abdominal:     General: There is no distension.     Palpations: Abdomen is soft.     Tenderness: There is no abdominal tenderness.  Musculoskeletal:     Cervical back: Normal range of motion and neck supple.     Right lower leg: No edema.     Left lower leg: No edema.  Lymphadenopathy:     Cervical: No cervical adenopathy.  Skin:    General: Skin is warm and dry.  Neurological:     Mental Status: She is alert and oriented to person, place, and time.  Psychiatric:        Behavior: Behavior normal.           Assessment & Plan:  Elevated BP- new.  Pt admits to high stress levels in the past week.  Currently asymptomatic.  Rather than starting meds will reassess in office in a few months.  Encouraged low sodium diet, increased water, and regular exercise.  Pt expressed understanding and is in  agreement w/ plan.

## 2024-05-07 NOTE — Assessment & Plan Note (Signed)
 Ongoing issue.  She is attempting to control w/ diet and exercise.  Check labs.  Start meds prn.

## 2024-05-07 NOTE — Patient Instructions (Signed)
Follow up in 6 months to recheck blood pressure We'll notify you of your lab results and make any changes if needed Keep up the good work on healthy diet and regular exercise- you look great! Call with any questions or concerns Stay Safe!  Stay Healthy! Happy Fall!!!

## 2024-05-08 ENCOUNTER — Ambulatory Visit: Payer: Self-pay | Admitting: Family Medicine

## 2024-05-08 ENCOUNTER — Other Ambulatory Visit: Payer: Self-pay

## 2024-05-08 DIAGNOSIS — R7989 Other specified abnormal findings of blood chemistry: Secondary | ICD-10-CM

## 2024-05-08 MED ORDER — MELOXICAM 15 MG PO TABS: 15.0000 mg | ORAL_TABLET | Freq: Every day | ORAL | 0 refills | Status: DC

## 2024-05-08 NOTE — Progress Notes (Signed)
 Patient verbalized understanding of labs  Future lab ordered  Lab appt made

## 2024-05-22 ENCOUNTER — Other Ambulatory Visit

## 2024-05-22 DIAGNOSIS — R7989 Other specified abnormal findings of blood chemistry: Secondary | ICD-10-CM | POA: Diagnosis not present

## 2024-05-23 ENCOUNTER — Ambulatory Visit: Payer: Self-pay | Admitting: Family Medicine

## 2024-05-23 DIAGNOSIS — R7989 Other specified abnormal findings of blood chemistry: Secondary | ICD-10-CM

## 2024-05-23 LAB — BASIC METABOLIC PANEL WITH GFR
BUN: 24 mg/dL — ABNORMAL HIGH (ref 6–23)
CO2: 30 meq/L (ref 19–32)
Calcium: 9.3 mg/dL (ref 8.4–10.5)
Chloride: 104 meq/L (ref 96–112)
Creatinine, Ser: 1.28 mg/dL — ABNORMAL HIGH (ref 0.40–1.20)
GFR: 43.39 mL/min — ABNORMAL LOW (ref >60.00)
Glucose, Bld: 74 mg/dL (ref 70–99)
Potassium: 5 meq/L (ref 3.5–5.1)
Sodium: 140 meq/L (ref 135–145)

## 2024-06-22 ENCOUNTER — Other Ambulatory Visit

## 2024-08-04 ENCOUNTER — Other Ambulatory Visit: Payer: Self-pay | Admitting: Family Medicine

## 2024-08-06 NOTE — Telephone Encounter (Signed)
 Requested Prescriptions   Pending Prescriptions Disp Refills   meloxicam  (MOBIC ) 15 MG tablet [Pharmacy Med Name: MELOXICAM  15 MG TABLET] 90 tablet 0    Sig: Take 1 tablet (15 mg total) by mouth daily.     Date of patient request: 08/06/24 Last office visit: 05/07/2024 Upcoming visit: 11/05/2024 Date of last refill: 05/08/24 Last refill amount: 90

## 2024-11-05 ENCOUNTER — Ambulatory Visit: Admitting: Family Medicine
